# Patient Record
Sex: Female | Born: 1990 | Race: White | Hispanic: No | Marital: Single | Smoking: Former smoker
Health system: Southern US, Community
[De-identification: ages and names within clinical notes are randomized; demographics above are authoritative.]

## PROBLEM LIST (undated history)

## (undated) DIAGNOSIS — F32A Depression, unspecified: Secondary | ICD-10-CM

## (undated) DIAGNOSIS — G932 Benign intracranial hypertension: Secondary | ICD-10-CM

## (undated) HISTORY — DX: Depression, unspecified: F32.A

## (undated) HISTORY — DX: Benign intracranial hypertension: G93.2

---
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
## 2015-10-26 ENCOUNTER — Encounter: Payer: Self-pay | Admitting: Family Medicine

## 2015-10-26 ENCOUNTER — Ambulatory Visit (INDEPENDENT_AMBULATORY_CARE_PROVIDER_SITE_OTHER): Payer: BLUE CROSS/BLUE SHIELD | Admitting: Family Medicine

## 2015-10-26 VITALS — BP 110/70 | HR 80 | Temp 98.1°F | Ht 65.0 in | Wt 214.0 lb

## 2015-10-26 DIAGNOSIS — R059 Cough, unspecified: Secondary | ICD-10-CM

## 2015-10-26 DIAGNOSIS — J0100 Acute maxillary sinusitis, unspecified: Secondary | ICD-10-CM

## 2015-10-26 DIAGNOSIS — M545 Low back pain, unspecified: Secondary | ICD-10-CM

## 2015-10-26 DIAGNOSIS — J101 Influenza due to other identified influenza virus with other respiratory manifestations: Secondary | ICD-10-CM

## 2015-10-26 DIAGNOSIS — R05 Cough: Secondary | ICD-10-CM

## 2015-10-26 LAB — POCT URINALYSIS DIPSTICK
Bilirubin, UA: NEGATIVE
Glucose, UA: NEGATIVE
Ketones, UA: NEGATIVE
Leukocytes, UA: NEGATIVE
Nitrite, UA: NEGATIVE
PH UA: 6
PROTEIN UA: NEGATIVE
RBC UA: NEGATIVE
SPEC GRAV UA: 1.01
UROBILINOGEN UA: 0.2

## 2015-10-26 LAB — POCT INFLUENZA A/B
INFLUENZA B, POC: POSITIVE — AB
Influenza A, POC: NEGATIVE

## 2015-10-26 MED ORDER — OSELTAMIVIR PHOSPHATE 75 MG PO CAPS: 75.0000 mg | ORAL_CAPSULE | Freq: Two times a day (BID) | ORAL | Status: DC

## 2015-10-26 MED ORDER — DOXYCYCLINE HYCLATE 100 MG PO TABS: 100.0000 mg | ORAL_TABLET | Freq: Two times a day (BID) | ORAL | Status: DC

## 2015-10-26 MED ORDER — GUAIFENESIN-CODEINE 100-10 MG/5ML PO SYRP: 5.0000 mL | ORAL_SOLUTION | Freq: Three times a day (TID) | ORAL | Status: DC | PRN

## 2015-10-26 NOTE — Patient Instructions (Signed)

## 2015-10-26 NOTE — Progress Notes (Signed)
Name: Diana Randolph   MRN: [id number]    DOB: 05/30/1991   Date:10/26/2015       Progress Note  Subjective  Chief Complaint  Chief Complaint  Patient presents with  . Sinusitis    back and neck hurting x 2 days ago/ vomitting, cong, sneezing with yellow production. Dry cough- tried Aleve otc    Sinusitis This is a new problem. The current episode started in the past 7 days. The problem has been waxing and waning since onset. The maximum temperature recorded prior to her arrival was 100.4 - 100.9 F. The pain is mild. Associated symptoms include coughing, headaches, sinus pressure and sneezing. Pertinent negatives include no chills, congestion, diaphoresis, ear pain, neck pain, shortness of breath or sore throat. (Prod yellow) Past treatments include acetaminophen. The treatment provided mild relief.  Cough This is a new problem. The current episode started yesterday. The problem has been waxing and waning. The cough is non-productive. Associated symptoms include headaches, myalgias, nasal congestion and postnasal drip. Pertinent negatives include no chest pain, chills, ear pain, fever, heartburn, rash, sore throat, shortness of breath, weight loss or wheezing. She has tried OTC cough suppressant for the symptoms. There is no history of environmental allergies.    No problem-specific assessment & plan notes found for this encounter.   No past medical history on file.  No past surgical history on file.  No family history on file.  Social History   Social History  . Marital Status: Single    Spouse Name: N/A  . Number of Children: N/A  . Years of Education: N/A   Occupational History  . Not on file.   Social History Main Topics  . Smoking status: Current Some Day Smoker  . Smokeless tobacco: Not on file  . Alcohol Use: 0.0 oz/week    0 Standard drinks or equivalent per week  . Drug Use: Yes     Comment: marijuana  . Sexual Activity: Not on file   Other Topics Concern  . Not  on file   Social History Narrative  . No narrative on file    Allergies  Allergen Reactions  . Azithromycin Diarrhea     Review of Systems  Constitutional: Negative for fever, chills, weight loss, malaise/fatigue and diaphoresis.  HENT: Positive for postnasal drip, sinus pressure and sneezing. Negative for congestion, ear discharge, ear pain and sore throat.   Eyes: Negative for blurred vision.  Respiratory: Positive for cough. Negative for sputum production, shortness of breath and wheezing.   Cardiovascular: Negative for chest pain, palpitations and leg swelling.  Gastrointestinal: Negative for heartburn, nausea, abdominal pain, diarrhea, constipation, blood in stool and melena.  Genitourinary: Negative for dysuria, urgency, frequency and hematuria.  Musculoskeletal: Positive for myalgias. Negative for back pain, joint pain and neck pain.  Skin: Negative for rash.  Neurological: Positive for headaches. Negative for dizziness, tingling, sensory change and focal weakness.  Endo/Heme/Allergies: Negative for environmental allergies and polydipsia. Does not bruise/bleed easily.  Psychiatric/Behavioral: Negative for depression and suicidal ideas. The patient is not nervous/anxious and does not have insomnia.      Objective  Filed Vitals:   10/26/15 1441  BP: 110/70  Pulse: 80  Temp: 98.1 F (36.7 C)  TempSrc: Oral  Height: 5\' 5"  (1.651 m)  Weight: 214 lb (97.07 kg)    Physical Exam  Constitutional: She is well-developed, well-nourished, and in no distress. No distress.  HENT:  Head: Normocephalic and atraumatic.  Right Ear: External ear normal.  Left Ear: External ear normal.  Nose: Right sinus exhibits maxillary sinus tenderness. Left sinus exhibits maxillary sinus tenderness.  Mouth/Throat: Oropharynx is clear and moist.  Eyes: Conjunctivae and EOM are normal. Pupils are equal, round, and reactive to light. Right eye exhibits no discharge. Left eye exhibits no  discharge.  Neck: Normal range of motion. Neck supple. No JVD present. No thyromegaly present.  Cardiovascular: Normal rate, regular rhythm, normal heart sounds and intact distal pulses.  Exam reveals no gallop and no friction rub.   No murmur heard. Pulmonary/Chest: Effort normal and breath sounds normal.  Abdominal: Soft. Bowel sounds are normal. She exhibits no mass. There is no tenderness. There is no guarding.  Musculoskeletal: Normal range of motion. She exhibits no edema.       Lumbar back: She exhibits spasm. She exhibits no tenderness.  Lymphadenopathy:    She has no cervical adenopathy.  Neurological: She is alert. She has normal reflexes.  Skin: Skin is warm and dry. She is not diaphoretic.  Psychiatric: Mood and affect normal.  Nursing note and vitals reviewed.     Assessment & Plan  Problem List Items Addressed This Visit    None    Visit Diagnoses    Cough    -  Primary    Relevant Medications    guaiFENesin-codeine (ROBITUSSIN AC) 100-10 MG/5ML syrup    Other Relevant Orders    POCT Influenza A/B (Completed)    Bilateral low back pain without sciatica        Relevant Orders    POCT Urinalysis Dipstick (Completed)    Acute maxillary sinusitis, recurrence not specified        Relevant Medications    doxycycline (VIBRA-TABS) 100 MG tablet    oseltamivir (TAMIFLU) 75 MG capsule    guaiFENesin-codeine (ROBITUSSIN AC) 100-10 MG/5ML syrup    Influenza B        Relevant Medications    oseltamivir (TAMIFLU) 75 MG capsule         Dr. Hayden [facility]  10/26/2015

## 2017-06-23 ENCOUNTER — Ambulatory Visit: Payer: 59 | Admitting: Family Medicine

## 2017-06-23 ENCOUNTER — Encounter: Payer: Self-pay | Admitting: Family Medicine

## 2017-06-23 VITALS — BP 120/78 | HR 80 | Ht 65.0 in | Wt 211.0 lb

## 2017-06-23 DIAGNOSIS — R002 Palpitations: Secondary | ICD-10-CM | POA: Diagnosis not present

## 2017-06-23 DIAGNOSIS — Z7689 Persons encountering health services in other specified circumstances: Secondary | ICD-10-CM

## 2017-06-23 DIAGNOSIS — H50812 Duane's syndrome, left eye: Secondary | ICD-10-CM

## 2017-06-23 DIAGNOSIS — G43809 Other migraine, not intractable, without status migrainosus: Secondary | ICD-10-CM | POA: Diagnosis not present

## 2017-06-23 DIAGNOSIS — Z3009 Encounter for other general counseling and advice on contraception: Secondary | ICD-10-CM | POA: Diagnosis not present

## 2017-06-23 DIAGNOSIS — F419 Anxiety disorder, unspecified: Secondary | ICD-10-CM | POA: Diagnosis not present

## 2017-06-23 NOTE — Progress Notes (Signed)
Name: Diana Randolph   MRN: [id number]    DOB: 02/15/1991   Date:06/23/2017       Progress Note  Subjective  Chief Complaint  Chief Complaint  Patient presents with  . Dizziness    been on Duloxetine for 4 weeks for headaches and anxiety- dizziness has gotten bad/ one of the side effects. Neurology in [place] will not return call. Needs to get in with neurology closer to here    Dizziness  This is a new problem. The current episode started more than 1 month ago (6-8 weeks). The problem occurs intermittently. The problem has been waxing and waning. Associated symptoms include headaches and nausea. Pertinent negatives include no abdominal pain, anorexia, arthralgias, change in bowel habit, chest pain, chills, congestion, coughing, diaphoresis, fatigue, fever, joint swelling, myalgias, neck pain, numbness, rash, sore throat, swollen glands, urinary symptoms, vertigo, visual change, vomiting or weakness. Associated symptoms comments: "lightheaded". The symptoms are aggravated by standing. She has tried acetaminophen (meclizine/arm pain) for the symptoms. The treatment provided moderate relief.  Palpitations   This is a new problem. The current episode started more than 1 month ago (2-3 months). Episode frequency: last episode saturday. The problem has been waxing and waning. Exacerbated by: change in position. Associated symptoms include anxiety, chest fullness, dizziness and nausea. Pertinent negatives include no chest pain, coughing, diaphoresis, fever, irregular heartbeat, malaise/fatigue, near-syncope, numbness, shortness of breath, syncope, vomiting or weakness. Associated symptoms comments: pressure. The treatment provided mild relief. There is no history of hyperthyroidism.    No problem-specific Assessment & Plan notes found for this encounter.   No past medical history on file.  No past surgical history on file.  No family history on file.  Social History   Socioeconomic History   . Marital status: Single    Spouse name: Not on file  . Number of children: Not on file  . Years of education: Not on file  . Highest education level: Not on file  Social Needs  . Financial resource strain: Not on file  . Food insecurity - worry: Not on file  . Food insecurity - inability: Not on file  . Transportation needs - medical: Not on file  . Transportation needs - non-medical: Not on file  Occupational History  . Not on file  Tobacco Use  . Smoking status: Former Smoker    Last attempt to quit: 06/23/2017  . Smokeless tobacco: Never Used  Substance and Sexual Activity  . Alcohol use: Yes    Alcohol/week: 0.0 oz  . Drug use: Yes    Comment: marijuana  . Sexual activity: Yes  Other Topics Concern  . Not on file  Social History Narrative  . Not on file    Allergies  Allergen Reactions  . Azithromycin Diarrhea    Outpatient Medications Prior to Visit  Medication Sig Dispense Refill  . DULoxetine (CYMBALTA) 30 MG capsule Take 30 mg daily by mouth.  5  . norethindrone (MICRONOR,CAMILA,ERRIN) 0.35 MG tablet Take 1 tablet daily by mouth.  6  . acetaZOLAMIDE (DIAMOX) 500 MG capsule Take 500 mg 2 (two) times daily by mouth.  0  . doxycycline (VIBRA-TABS) 100 MG tablet Take 1 tablet (100 mg total) by mouth 2 (two) times daily. 20 tablet 0  . guaiFENesin-codeine (ROBITUSSIN AC) 100-10 MG/5ML syrup Take 5 mLs by mouth 3 (three) times daily as needed for cough. 150 mL 0  . meclizine (ANTIVERT) 25 MG tablet TAKE 1 TABLET BY MOUTH THREE TIMES DAILY AS  NEEDED FOR DIZZINESS  1  . oseltamivir (TAMIFLU) 75 MG capsule Take 1 capsule (75 mg total) by mouth 2 (two) times daily. 10 capsule 0   No facility-administered medications prior to visit.     Review of Systems  Constitutional: Negative for chills, diaphoresis, fatigue, fever, malaise/fatigue and weight loss.  HENT: Negative for congestion, ear discharge, ear pain and sore throat.   Eyes: Negative for blurred vision.   Respiratory: Negative for cough, sputum production, shortness of breath and wheezing.   Cardiovascular: Positive for palpitations. Negative for chest pain, leg swelling, syncope and near-syncope.  Gastrointestinal: Positive for nausea. Negative for abdominal pain, anorexia, blood in stool, change in bowel habit, constipation, diarrhea, heartburn, melena and vomiting.  Genitourinary: Negative for dysuria, frequency, hematuria and urgency.  Musculoskeletal: Negative for arthralgias, back pain, joint pain, joint swelling, myalgias and neck pain.  Skin: Negative for rash.  Neurological: Positive for dizziness and headaches. Negative for vertigo, tingling, sensory change, focal weakness, weakness and numbness.  Endo/Heme/Allergies: Negative for environmental allergies and polydipsia. Does not bruise/bleed easily.  Psychiatric/Behavioral: Negative for depression and suicidal ideas. The patient is nervous/anxious. The patient does not have insomnia.      Objective  Vitals:   06/23/17 1525  BP: 120/78  Pulse: 80  Weight: 211 lb (95.7 kg)  Height: 5\' 5"  (1.651 m)    Physical Exam  Constitutional: She is well-developed, well-nourished, and in no distress. No distress.  HENT:  Head: Normocephalic and atraumatic.  Right Ear: External ear normal.  Left Ear: External ear normal.  Nose: Nose normal.  Mouth/Throat: Oropharynx is clear and moist.  Eyes: Conjunctivae and EOM are normal. Pupils are equal, round, and reactive to light. Right eye exhibits no discharge. Left eye exhibits no discharge.  Neck: Normal range of motion. Neck supple. No JVD present. No thyromegaly present.  Cardiovascular: Normal rate, regular rhythm, normal heart sounds and intact distal pulses. Exam reveals no gallop and no friction rub.  No murmur heard. Pulmonary/Chest: Effort normal and breath sounds normal. She has no wheezes. She has no rales.  Abdominal: Soft. Bowel sounds are normal. She exhibits no mass. There is  no tenderness. There is no guarding.  Musculoskeletal: Normal range of motion. She exhibits no edema.  Lymphadenopathy:    She has no cervical adenopathy.  Neurological: She is alert. She has normal sensation, normal strength and normal reflexes. A cranial nerve deficit is present.  Abn left ocular motion  Skin: Skin is warm and dry. No rash noted. She is not diaphoretic.  Psychiatric: Mood and affect normal.  Nursing note and vitals reviewed.     Assessment & Plan  Problem List Items Addressed This Visit    None    Visit Diagnoses    Establishing care with new doctor, encounter for    -  Primary   Palpitations       Relevant Orders   TSH   Headache, variant migraine       [facility]   Relevant Medications   DULoxetine (CYMBALTA) 30 MG capsule   Other Relevant Orders   Ambulatory referral to Neurology   Anxiety       cont cymbalta   Relevant Medications   DULoxetine (CYMBALTA) 30 MG capsule   Encounter for other general counseling and advice on contraception       Relevant Orders   Ambulatory referral to Gynecology   Duane syndrome of left eye  No orders of the defined types were placed in this encounter.     Dr. Hayden Rasmusseneanna Margaretha [facility]  06/23/17

## 2017-06-24 LAB — TSH: TSH: 3.5 u[IU]/mL (ref 0.450–4.500)

## 2018-02-17 ENCOUNTER — Other Ambulatory Visit
Admission: RE | Admit: 2018-02-17 | Discharge: 2018-02-17 | Disposition: A | Discharge status: Home / Self Care | Payer: Self-pay | Source: Ambulatory Visit | Attending: Family Medicine | Admitting: Family Medicine

## 2018-02-17 ENCOUNTER — Ambulatory Visit (INDEPENDENT_AMBULATORY_CARE_PROVIDER_SITE_OTHER): Payer: Self-pay | Admitting: Family Medicine

## 2018-02-17 ENCOUNTER — Encounter: Payer: Self-pay | Admitting: Family Medicine

## 2018-02-17 VITALS — BP 110/62 | HR 106 | Temp 98.3°F | Ht 65.0 in | Wt 222.0 lb

## 2018-02-17 DIAGNOSIS — J029 Acute pharyngitis, unspecified: Secondary | ICD-10-CM

## 2018-02-17 DIAGNOSIS — R509 Fever, unspecified: Secondary | ICD-10-CM | POA: Insufficient documentation

## 2018-02-17 LAB — CBC WITH DIFFERENTIAL/PLATELET
BASOS ABS: 0 10*3/uL (ref 0–0.1)
Basophils Relative: 0 %
EOS PCT: 0 %
Eosinophils Absolute: 0 10*3/uL (ref 0–0.7)
HCT: 43 % (ref 35.0–47.0)
HEMOGLOBIN: 14.3 g/dL (ref 12.0–16.0)
LYMPHS ABS: 0.9 10*3/uL — AB (ref 1.0–3.6)
Lymphocytes Relative: 10 %
MCH: 27.5 pg (ref 26.0–34.0)
MCHC: 33.2 g/dL (ref 32.0–36.0)
MCV: 82.8 fL (ref 80.0–100.0)
Monocytes Absolute: 0.5 10*3/uL (ref 0.2–0.9)
Monocytes Relative: 6 %
NEUTROS ABS: 7.7 10*3/uL — AB (ref 1.4–6.5)
NEUTROS PCT: 84 %
PLATELETS: 419 10*3/uL (ref 150–440)
RBC: 5.19 MIL/uL (ref 3.80–5.20)
RDW: 13.9 % (ref 11.5–14.5)
WBC: 9.1 10*3/uL (ref 3.6–11.0)

## 2018-02-17 LAB — MONONUCLEOSIS SCREEN: Mono Screen: NEGATIVE

## 2018-02-17 MED ORDER — GENERIC EXTERNAL MEDICATION
1.00 | Status: DC
Start: ? — End: 2018-02-17

## 2018-02-17 MED ORDER — DEXTROSE 50 % IV SOLN
12.50 | INTRAVENOUS | Status: DC
Start: ? — End: 2018-02-17

## 2018-02-17 MED ORDER — LIDOCAINE HCL (PF) 1 % IJ SOLN
0.50 | INTRAMUSCULAR | Status: DC
Start: ? — End: 2018-02-17

## 2018-02-17 NOTE — Patient Instructions (Signed)
Infectious Mononucleosis  Infectious mononucleosis is a viral infection. It is often referred to as "mono." It causes symptoms that affect various areas of the body, including the throat, upper air passages, and lymph glands. The liver or spleen may also be affected.  The virus spreads from person to person (is contagious) through close contact. The illness is usually not serious, and it typically goes away in 2-4 weeks without treatment. In rare cases, symptoms can be more severe and last longer, sometimes up to several months.  What are the causes?  This condition is commonly caused by the Epstein-Barr virus. This virus spreads through:   Contact with an infected person's saliva or other bodily fluids, often through:  ? Kissing.  ? Sexual contact.  ? Coughing.  ? Sneezing.   Sharing utensils or drinking glasses that were recently used by an infected person.   Blood transfusions.   Organ transplantation.    What increases the risk?  You are more likely to develop this condition if:   You are 15-24 years old.    What are the signs or symptoms?  Symptoms of this condition usually appear 4-6 weeks after infection. Symptoms may develop slowly and occur at different times. Common symptoms include:   Sore throat.   Headache.   Extreme fatigue.   Muscle aches.   Swollen glands.   Fever.   Poor appetite.   Rash.    Other symptoms include:   Enlarged liver or spleen.   Nausea.   Abdominal pain.    How is this diagnosed?  This condition may be diagnosed based on:   Your medical history.   Your symptoms.   A physical exam.   Blood tests to confirm the diagnosis.    How is this treated?  There is no cure for this condition. Infectious mononucleosis usually goes away on its own with time. Treatment can help relieve symptoms and may include:   Taking medicines to relieve pain and fever.   Drinking plenty of fluids.   Getting a lot of rest.   Medicine (corticosteroids)to reduce swelling. This may be used  if swelling in the throat causes breathing or swallowing problems.    In some severe cases, treatment has to be given in a hospital.  Follow these instructions at home:  Medicines   Take over-the-counter and prescription medicines only as told by your health care provider.   Do not take ampicillin or amoxicillin. This may cause a rash.   If you are under 18, do not take aspirin because of the association with Reye syndrome.  Activity   Rest as needed.   Do not participate in any of the following activities until your health care provider approves:  ? Contact sports. You may need to wait at least a month before participating in sports.  ? Exercise that requires a lot of energy.  ? Heavy lifting.   Gradually resume your normal activities after your fever is gone, or when your health care provider tells you that you can. Be sure to rest when you get tired.  General instructions   Avoid kissing or sharing utensils or drinking glasses until your health care provider tells you that you are no longer contagious.   Drink enough fluid to keep your urine clear or pale yellow.   Do not drink alcohol.   If you have a sore throat:  ? Gargle with a salt-water mixture 3-4 times a day or as needed. To make a salt-water mixture,   completely dissolve -1 tsp of salt in 1 cup of warm water.  ? Eat soft foods. Cold foods such as ice cream or frozen ice pops can soothe a sore throat.  ? Try sucking on hard candy.   Wash your hands often with soap and water to avoid spreading the infection. If soap and water are not available, use hand sanitizer.  How is this prevented?   Avoid contact with people who are infected with mononucleosis. An infected person may not always appear ill, but he or she can still spread the virus.   Avoid sharing utensils, drinking glasses, or toothbrushes.   Wash your hands frequently with soap and water. If soap and water are not available, use hand sanitizer.   Use the inside of your elbow to cover  your mouth when coughing or sneezing.  Contact a health care provider if:   Your fever is not gone after 10 days.   You have swollen lymph nodes that are not back to normal after 4 weeks.   Your activity level is not back to normal after 2 months.   Your skin or the white parts of your eyes turn yellow (jaundice).   You have constipation. This may mean that you have:  ? Fewer bowel movements in a week than normal.  ? Difficulty having a bowel movement.  ? Stools that are dry, hard, or larger than normal.  Get help right away if:   You have severe pain in your abdomen or shoulder.   You are drooling.   You have trouble swallowing.   You have trouble breathing.   You develop a stiff neck.   You develop a severe headache.   You cannot stop vomiting.   You have jerky movements that you cannot control (seizures).   You are confused.   You have trouble with balance.   Your nose or gums begin to bleed.   You have signs of dehydration. These may include:  ? Weakness.  ? Sunken eyes.  ? Pale skin.  ? Dry mouth.  ? Rapid breathing or pulse.  Summary   Infectious mononucleosis, or "mono," is an infection caused by the Epstein-Barr virus.   The virus that causes this condition is spread through bodily fluids. The virus is most commonly spread by kissing or sharing drinks or utensils with an infected person.   You are more likely to develop this infection if you are 15-24 years old.   Symptoms of this condition can include sore throat, headache, fever, swollen glands, muscle aches, extreme fatigue, and swollen liver or spleen.   There is no cure for this condition. The goal of treatment is to help relieve symptoms. Treatment may include drinking plenty of water, getting a lot of rest, and taking pain relievers.  This information is not intended to replace advice given to you by your health care provider. Make sure you discuss any questions you have with your health care provider.  Document Released:  07/26/2000 Document Revised: 04/16/2016 Document Reviewed: 04/16/2016  [employer] Interactive Patient Education  2017 [employer]

## 2018-02-17 NOTE — Progress Notes (Addendum)
Name: Diana Randolph   MRN: [id number]    DOB: 18-Oct-1990   Date:02/17/2018       Progress Note  Subjective  Chief Complaint  Chief Complaint  Patient presents with  . Follow-up    seen in ER for sore throat on the 6th of July- was given Aug and Medrol dose pack- throat isn't better, but only on day 3 of antibiotic    Sore Throat   This is a new problem. The current episode started in the past 7 days. The pain is worse on the left side. Maximum temperature: low grade fever. The fever has been present for 3 to 4 days. The pain is moderate. Associated symptoms include headaches, neck pain, swollen glands and trouble swallowing. Pertinent negatives include no abdominal pain, congestion, coughing, diarrhea, drooling, ear discharge, ear pain, hoarse voice, shortness of breath, stridor or vomiting. She has had no exposure to strep or mono. She has tried acetaminophen (presently on augmentin) for the symptoms. The treatment provided mild relief.    No problem-specific Assessment & Plan notes found for this encounter.   History reviewed. No pertinent past medical history.  History reviewed. No pertinent surgical history.  History reviewed. No pertinent family history.  Social History   Socioeconomic History  . Marital status: Single    Spouse name: Not on file  . Number of children: Not on file  . Years of education: Not on file  . Highest education level: Not on file  Occupational History  . Not on file  Social Needs  . Financial resource strain: Not on file  . Food insecurity:    Worry: Not on file    Inability: Not on file  . Transportation needs:    Medical: Not on file    Non-medical: Not on file  Tobacco Use  . Smoking status: Former Smoker    Last attempt to quit: 06/23/2017    Years since quitting: 0.6  . Smokeless tobacco: Never Used  Substance and Sexual Activity  . Alcohol use: Yes    Alcohol/week: 0.0 oz  . Drug use: Yes    Comment: marijuana  . Sexual activity:  Yes  Lifestyle  . Physical activity:    Days per week: Not on file    Minutes per session: Not on file  . Stress: Not on file  Relationships  . Social connections:    Talks on phone: Not on file    Gets together: Not on file    Attends religious service: Not on file    Active member of club or organization: Not on file    Attends meetings of clubs or organizations: Not on file    Relationship status: Not on file  . Intimate partner violence:    Fear of current or ex partner: Not on file    Emotionally abused: Not on file    Physically abused: Not on file    Forced sexual activity: Not on file  Other Topics Concern  . Not on file  Social History Narrative  . Not on file    Allergies  Allergen Reactions  . Azithromycin Diarrhea    Outpatient Medications Prior to Visit  Medication Sig Dispense Refill  . amoxicillin-clavulanate (AUGMENTIN) 875-125 MG tablet Take 1 tablet by mouth 2 (two) times daily.    . DULoxetine (CYMBALTA) 30 MG capsule Take 30 mg daily by mouth.  5  . methylPREDNISolone (MEDROL DOSEPAK) 4 MG TBPK tablet Follow package directions.    . norethindrone (  MICRONOR,CAMILA,ERRIN) 0.35 MG tablet Take 1 tablet daily by mouth.  6  . nortriptyline (PAMELOR) 10 MG capsule Take 1 capsule by mouth at bedtime. Ashley Stepp     No facilitDot Lanesy-administered medications prior to visit.     Review of Systems  HENT: Positive for trouble swallowing. Negative for congestion, drooling, ear discharge, ear pain and hoarse voice.   Respiratory: Negative for cough, shortness of breath and stridor.   Gastrointestinal: Negative for abdominal pain, diarrhea and vomiting.  Musculoskeletal: Positive for neck pain.  Neurological: Positive for headaches.     Objective  Vitals:   02/17/18 1411  BP: 110/62  Pulse: (!) 106  Temp: 98.3 F (36.8 C)  TempSrc: Oral  Weight: 222 lb (100.7 kg)  Height: 5\' 5"  (1.651 m)    Physical Exam  Constitutional: No distress.  HENT:  Head:  Normocephalic and atraumatic.  Right Ear: Tympanic membrane and external ear normal.  Left Ear: Tympanic membrane and external ear normal.  Nose: Nose normal. No mucosal edema.  Mouth/Throat: Mucous membranes are normal. Oropharyngeal exudate and posterior oropharyngeal erythema present. No posterior oropharyngeal edema or tonsillar abscesses.  Eyes: Pupils are equal, round, and reactive to light. Conjunctivae and EOM are normal. Right eye exhibits no discharge. Left eye exhibits no discharge.  Neck: Normal range of motion. Neck supple. No JVD present. No thyromegaly present.  Cardiovascular: Normal rate, regular rhythm, normal heart sounds and intact distal pulses. Exam reveals no gallop and no friction rub.  No murmur heard. Pulmonary/Chest: Effort normal and breath sounds normal.  Abdominal: Soft. Bowel sounds are normal. She exhibits no mass. There is no splenomegaly. There is no tenderness. There is no guarding.  Musculoskeletal: Normal range of motion. She exhibits no edema.  Lymphadenopathy:       Head (right side): Submandibular adenopathy present. No submental adenopathy present.       Head (left side): Submandibular adenopathy present. No submental adenopathy present.    She has no cervical adenopathy.  Neurological: She is alert. She has normal reflexes.  Skin: Skin is warm and dry. She is not diaphoretic.      Assessment & Plan  Problem List Items Addressed This Visit    None    Visit Diagnoses    Pharyngitis, unspecified etiology    -  Primary   Continues sore throat . Continue augmentin/medrol dosepack/ recheck cbc diff/ and mono/ diferential :partially treated strep/vs mono/ mono-negative- continue tx      No orders of the defined types were placed in this encounter.     Dr. Hayden Rasmusseneanna Taylr Meuth [facility]  02/17/18

## 2019-04-15 ENCOUNTER — Other Ambulatory Visit: Payer: Self-pay

## 2019-04-15 DIAGNOSIS — Z20822 Contact with and (suspected) exposure to covid-19: Secondary | ICD-10-CM

## 2019-04-16 LAB — NOVEL CORONAVIRUS, NAA: SARS-CoV-2, NAA: NOT DETECTED

## 2020-06-16 ENCOUNTER — Other Ambulatory Visit: Payer: Self-pay

## 2020-06-16 ENCOUNTER — Ambulatory Visit
Admission: EM | Admit: 2020-06-16 | Discharge: 2020-06-16 | Disposition: A | Discharge status: Home / Self Care | Payer: Self-pay | Attending: Emergency Medicine | Admitting: Emergency Medicine

## 2020-06-16 DIAGNOSIS — Z20822 Contact with and (suspected) exposure to covid-19: Secondary | ICD-10-CM | POA: Insufficient documentation

## 2020-06-16 DIAGNOSIS — R051 Acute cough: Secondary | ICD-10-CM | POA: Insufficient documentation

## 2020-06-16 DIAGNOSIS — Z87891 Personal history of nicotine dependence: Secondary | ICD-10-CM | POA: Insufficient documentation

## 2020-06-16 DIAGNOSIS — Z881 Allergy status to other antibiotic agents status: Secondary | ICD-10-CM | POA: Insufficient documentation

## 2020-06-16 DIAGNOSIS — J069 Acute upper respiratory infection, unspecified: Secondary | ICD-10-CM | POA: Insufficient documentation

## 2020-06-16 DIAGNOSIS — Z79899 Other long term (current) drug therapy: Secondary | ICD-10-CM | POA: Insufficient documentation

## 2020-06-16 LAB — GROUP A STREP BY PCR: Group A Strep by PCR: NOT DETECTED

## 2020-06-16 MED ORDER — PROMETHAZINE-DM 6.25-15 MG/5ML PO SYRP: 5.0000 mL | ORAL_SOLUTION | Freq: Four times a day (QID) | ORAL | 0 refills | Status: DC | PRN

## 2020-06-16 NOTE — ED Triage Notes (Signed)
Patient complains of sore throat, body aches, chills, nasal congestion x 3 days.

## 2020-06-16 NOTE — ED Provider Notes (Signed)
[facility]    CSN: [id number] Arrival date & time: 06/16/20  1917      History   Chief Complaint Chief Complaint  Patient presents with  . Sore Throat    HPI Diana Randolph is a 29 y.o. female.   81-year-old female here for evaluation of sore throat, body aches, chills, cough, and nasal congestion.  Patient reports that her symptoms started 3 days ago.  She works at [employer] and all of her coworkers have similar symptoms.  She reports that she has a dry cough, sinus pain, clear nasal discharge, shortness of breath and wheezing.  Patient denies fever, ear pressure, body aches, or changes to his taste or smell sense.  Patient has been fully vaccinated with visor against Covid.     History reviewed. No pertinent past medical history.  There are no problems to display for this patient.   History reviewed. No pertinent surgical history.  OB History   No obstetric history on file.      Home Medications    Prior to Admission medications   Medication Sig Start Date End Date Taking? Authorizing Provider  DULoxetine (CYMBALTA) 30 MG capsule Take 30 mg daily by mouth. 06/20/17  Yes [provider]  norethindrone (MICRONOR,CAMILA,ERRIN) 0.35 MG tablet Take 1 tablet daily by mouth. 06/20/17  Yes [provider]  nortriptyline (PAMELOR) 10 MG capsule Take 1 capsule by mouth at bedtime. Dot Lanes 01/01/18  Yes [provider]  promethazine-dextromethorphan (PROMETHAZINE-DM) 6.25-15 MG/5ML syrup Take 5 mLs by mouth 4 (four) times daily as needed. 06/16/20   Becky Augusta, NP    Family History Family History  Problem Relation Age of Onset  . Healthy Mother   . Heart attack Father     Social History Social History   Tobacco Use  . Smoking status: Former Smoker    Quit date: 06/23/2017    Years since quitting: 2.9  . Smokeless tobacco: Never Used  Vaping Use  . Vaping Use: Never used  Substance Use Topics  . Alcohol use: Yes     Alcohol/week: 0.0 standard drinks  . Drug use: Yes    Comment: marijuana     Allergies   Azithromycin   Review of Systems Review of Systems  Constitutional: Negative for activity change, appetite change and fever.  HENT: Positive for congestion, postnasal drip, rhinorrhea, sinus pressure and sore throat. Negative for ear pain.   Respiratory: Positive for cough, shortness of breath and wheezing.   Cardiovascular: Negative for chest pain.  Gastrointestinal: Negative for diarrhea, nausea and vomiting.  Genitourinary: Negative for hematuria.  Musculoskeletal: Positive for arthralgias and myalgias.  Skin: Negative for rash.  Neurological: Negative for headaches.  Hematological: Negative.   Psychiatric/Behavioral: Negative.      Physical Exam Triage Vital Signs ED Triage Vitals  Enc Vitals Group     BP 06/16/20 1930 (!) 125/100     Pulse Rate 06/16/20 1930 (!) 115     Resp 06/16/20 1930 18     Temp 06/16/20 1930 98.6 F (37 C)     Temp Source 06/16/20 1930 Oral     SpO2 06/16/20 1930 100 %     Weight 06/16/20 1928 220 lb (99.8 kg)     Height 06/16/20 1928 5\' 6"  (1.676 m)     Head Circumference --      Peak Flow --      Pain Score 06/16/20 1927 7     Pain Loc --  Pain Edu? --      Excl. in GC? --    No data found.  Updated Vital Signs BP (!) 125/100 (BP Location: Left Arm)   Pulse (!) 115   Temp 98.6 F (37 C) (Oral)   Resp 18   Ht 5\' 6"  (1.676 m)   Wt 220 lb (99.8 kg)   LMP 05/30/2020   SpO2 100%   BMI 35.51 kg/m   Visual Acuity Right Eye Distance:   Left Eye Distance:   Bilateral Distance:    Right Eye Near:   Left Eye Near:    Bilateral Near:     Physical Exam Vitals and nursing note reviewed.  Constitutional:      General: She is not in acute distress.    Appearance: She is well-developed. She is not toxic-appearing.  HENT:     Head: Normocephalic and atraumatic.     Right Ear: Tympanic membrane and ear canal normal. No middle ear  effusion. Tympanic membrane is not erythematous.     Left Ear: Tympanic membrane and ear canal normal.  No middle ear effusion. Tympanic membrane is not erythematous.     Nose: Congestion and rhinorrhea present.     Comments: Nasal mucosa is erythematous and edematous with clear nasal discharge.    Mouth/Throat:     Mouth: Mucous membranes are moist.     Pharynx: Oropharynx is clear. Posterior oropharyngeal erythema present. No oropharyngeal exudate.     Tonsils: No tonsillar exudate. 0 on the right. 0 on the left.     Comments: Posterior oropharynx has erythema and clear postnasal drip.  Tonsillar pillars are pink, edematous, and free of exudate. Eyes:     Conjunctiva/sclera: Conjunctivae normal.     Pupils: Pupils are equal, round, and reactive to light.  Cardiovascular:     Rate and Rhythm: Normal rate and regular rhythm.     Heart sounds: Normal heart sounds. No murmur heard.  No gallop.   Pulmonary:     Effort: Pulmonary effort is normal.     Breath sounds: Normal breath sounds. No wheezing, rhonchi or rales.  Musculoskeletal:     Cervical back: Normal range of motion and neck supple.  Lymphadenopathy:     Cervical: No cervical adenopathy.  Skin:    General: Skin is warm and dry.     Capillary Refill: Capillary refill takes less than 2 seconds.     Findings: No rash.  Neurological:     General: No focal deficit present.     Mental Status: She is alert and oriented to person, place, and time.  Psychiatric:        Mood and Affect: Mood normal.        Behavior: Behavior normal.      UC Treatments / Results  Labs (all labs ordered are listed, but only abnormal results are displayed) Labs Reviewed  GROUP A STREP BY PCR  SARS CORONAVIRUS 2 (TAT 6-24 HRS)    EKG   Radiology No results found.  Procedures Procedures (including critical care time)  Medications Ordered in UC Medications - No data to display  Initial Impression / Assessment and Plan / UC Course  I  have reviewed the triage vital signs and the nursing notes.  Pertinent labs & imaging results that were available during my care of the patient were reviewed by me and considered in my medical decision making (see chart for details).   Evaluation of URI symptoms x3 days.  She works at [employer] and all of her coworkers have similar symptoms.  Physical exam reveals edematous and erythematous nasal mucosa with clear nasal discharge and clear postnasal drip.  Tonsillar pillars are unremarkable.  No lymphadenopathy appreciable on exam.  Lung sounds have wheezing in upper lobes but clear throughout.  No rales or rhonchi.  Will check strep and Covid.  Strep PCR is negative.  Covid is pending.  Will DC patient home with diagnosis of URI.  Will treat with Promethazine DM for cough.  Patient instructed to use her albuterol inhaler that she was prescribed earlier.  Final Clinical Impressions(s) / UC Diagnoses   Final diagnoses:  Viral URI with cough     Discharge Instructions     Isolate at home until your Covid test comes back.  If your test is positive you will need to quarantine for 10 days from the start of your symptoms.  After 10 days you can break quarantine if your symptoms have improved and you have not had a fever for 24 hours.  Use Tylenol and ibuprofen as needed for fever and pain.  Take the Promethazine DM every 6 hours as needed for cough and congestion.  This will make you drowsy so it may be better to save it for bedtime and use something like Delsym during the day.  If your Covid test is positive we will refer you to the infusion clinic for monoclonal antibody infusion.  If you develop worsening shortness of breath, cannot catch her breath at rest, or cannot speak a full sentence the need to go to the ER for evaluation.    ED Prescriptions    Medication Sig Dispense Auth. Provider   promethazine-dextromethorphan (PROMETHAZINE-DM) 6.25-15 MG/5ML syrup Take 5 mLs by mouth 4  (four) times daily as needed. 118 mL Becky Augusta, NP     PDMP not reviewed this encounter.   Becky Augusta, NP 06/16/20 2019

## 2020-06-16 NOTE — Discharge Instructions (Addendum)
Isolate at home until your Covid test comes back.  If your test is positive you will need to quarantine for 10 days from the start of your symptoms.  After 10 days you can break quarantine if your symptoms have improved and you have not had a fever for 24 hours.  Use Tylenol and ibuprofen as needed for fever and pain.  Take the Promethazine DM every 6 hours as needed for cough and congestion.  This will make you drowsy so it may be better to save it for bedtime and use something like Delsym during the day.  If your Covid test is positive we will refer you to the infusion clinic for monoclonal antibody infusion.  If you develop worsening shortness of breath, cannot catch her breath at rest, or cannot speak a full sentence the need to go to the ER for evaluation.

## 2020-06-17 LAB — SARS CORONAVIRUS 2 (TAT 6-24 HRS): SARS Coronavirus 2: NEGATIVE

## 2021-01-25 ENCOUNTER — Other Ambulatory Visit: Payer: Self-pay | Admitting: Family Medicine

## 2021-01-25 DIAGNOSIS — R102 Pelvic and perineal pain: Secondary | ICD-10-CM

## 2021-01-26 ENCOUNTER — Ambulatory Visit (HOSPITAL_BASED_OUTPATIENT_CLINIC_OR_DEPARTMENT_OTHER)
Admission: RE | Admit: 2021-01-26 | Discharge: 2021-01-26 | Disposition: A | Discharge status: Home / Self Care | Payer: BC Managed Care – PPO | Source: Ambulatory Visit | Attending: Family Medicine | Admitting: Family Medicine

## 2021-01-26 ENCOUNTER — Other Ambulatory Visit: Payer: Self-pay

## 2021-01-26 DIAGNOSIS — R102 Pelvic and perineal pain: Secondary | ICD-10-CM | POA: Diagnosis present

## 2021-04-06 ENCOUNTER — Emergency Department (HOSPITAL_COMMUNITY): Payer: Self-pay

## 2021-04-06 ENCOUNTER — Other Ambulatory Visit: Payer: Self-pay

## 2021-04-06 ENCOUNTER — Emergency Department (HOSPITAL_COMMUNITY)
Admission: EM | Admit: 2021-04-06 | Discharge: 2021-04-06 | Disposition: A | Discharge status: Home / Self Care | Payer: Self-pay | Attending: Emergency Medicine | Admitting: Emergency Medicine

## 2021-04-06 DIAGNOSIS — R1110 Vomiting, unspecified: Secondary | ICD-10-CM

## 2021-04-06 DIAGNOSIS — E860 Dehydration: Secondary | ICD-10-CM

## 2021-04-06 DIAGNOSIS — R197 Diarrhea, unspecified: Secondary | ICD-10-CM | POA: Insufficient documentation

## 2021-04-06 DIAGNOSIS — Z87891 Personal history of nicotine dependence: Secondary | ICD-10-CM | POA: Insufficient documentation

## 2021-04-06 DIAGNOSIS — R1084 Generalized abdominal pain: Secondary | ICD-10-CM

## 2021-04-06 DIAGNOSIS — R1013 Epigastric pain: Secondary | ICD-10-CM | POA: Insufficient documentation

## 2021-04-06 DIAGNOSIS — Y900 Blood alcohol level of less than 20 mg/100 ml: Secondary | ICD-10-CM | POA: Insufficient documentation

## 2021-04-06 DIAGNOSIS — K529 Noninfective gastroenteritis and colitis, unspecified: Secondary | ICD-10-CM

## 2021-04-06 DIAGNOSIS — Z20822 Contact with and (suspected) exposure to covid-19: Secondary | ICD-10-CM | POA: Insufficient documentation

## 2021-04-06 DIAGNOSIS — R112 Nausea with vomiting, unspecified: Secondary | ICD-10-CM | POA: Insufficient documentation

## 2021-04-06 LAB — CBC WITH DIFFERENTIAL/PLATELET
Abs Immature Granulocytes: 0.16 10*3/uL — ABNORMAL HIGH (ref 0.00–0.07)
Basophils Absolute: 0 10*3/uL (ref 0.0–0.1)
Basophils Relative: 0 %
Eosinophils Absolute: 0 10*3/uL (ref 0.0–0.5)
Eosinophils Relative: 0 %
HCT: 47 % — ABNORMAL HIGH (ref 36.0–46.0)
Hemoglobin: 15.8 g/dL — ABNORMAL HIGH (ref 12.0–15.0)
Immature Granulocytes: 1 %
Lymphocytes Relative: 7 %
Lymphs Abs: 1.2 10*3/uL (ref 0.7–4.0)
MCH: 28.8 pg (ref 26.0–34.0)
MCHC: 33.6 g/dL (ref 30.0–36.0)
MCV: 85.8 fL (ref 80.0–100.0)
Monocytes Absolute: 0.7 10*3/uL (ref 0.1–1.0)
Monocytes Relative: 4 %
Neutro Abs: 14.4 10*3/uL — ABNORMAL HIGH (ref 1.7–7.7)
Neutrophils Relative %: 88 %
Platelets: 513 10*3/uL — ABNORMAL HIGH (ref 150–400)
RBC: 5.48 MIL/uL — ABNORMAL HIGH (ref 3.87–5.11)
RDW: 13.1 % (ref 11.5–15.5)
WBC: 16.5 10*3/uL — ABNORMAL HIGH (ref 4.0–10.5)
nRBC: 0 % (ref 0.0–0.2)

## 2021-04-06 LAB — PREGNANCY, URINE: Preg Test, Ur: NEGATIVE

## 2021-04-06 LAB — COMPREHENSIVE METABOLIC PANEL
ALT: 11 U/L (ref 0–44)
AST: 24 U/L (ref 15–41)
Albumin: 4.1 g/dL (ref 3.5–5.0)
Alkaline Phosphatase: 51 U/L (ref 38–126)
Anion gap: 12 (ref 5–15)
BUN: 10 mg/dL (ref 6–20)
CO2: 19 mmol/L — ABNORMAL LOW (ref 22–32)
Calcium: 9.4 mg/dL (ref 8.9–10.3)
Chloride: 105 mmol/L (ref 98–111)
Creatinine, Ser: 0.81 mg/dL (ref 0.44–1.00)
GFR, Estimated: >60 mL/min (ref >60)
Glucose, Bld: 173 mg/dL — ABNORMAL HIGH (ref 70–99)
Potassium: 3.8 mmol/L (ref 3.5–5.1)
Sodium: 136 mmol/L (ref 135–145)
Total Bilirubin: 1.4 mg/dL — ABNORMAL HIGH (ref 0.3–1.2)
Total Protein: 7.1 g/dL (ref 6.5–8.1)

## 2021-04-06 LAB — URINALYSIS, MICROSCOPIC (REFLEX)

## 2021-04-06 LAB — URINALYSIS, ROUTINE W REFLEX MICROSCOPIC
Bilirubin Urine: NEGATIVE
Glucose, UA: NEGATIVE mg/dL
Hgb urine dipstick: NEGATIVE
Ketones, ur: >80 mg/dL — AB
Nitrite: NEGATIVE
Protein, ur: NEGATIVE mg/dL
Specific Gravity, Urine: 1.025 (ref 1.005–1.030)
pH: 8 (ref 5.0–8.0)

## 2021-04-06 LAB — LACTIC ACID, PLASMA: Lactic Acid, Venous: 4.5 mmol/L (ref 0.5–1.9)

## 2021-04-06 LAB — LIPASE, BLOOD: Lipase: 26 U/L (ref 11–51)

## 2021-04-06 LAB — ETHANOL: Alcohol, Ethyl (B): <10 mg/dL (ref <10)

## 2021-04-06 LAB — RESP PANEL BY RT-PCR (FLU A&B, COVID) ARPGX2
Influenza A by PCR: NEGATIVE
Influenza B by PCR: NEGATIVE
SARS Coronavirus 2 by RT PCR: NEGATIVE

## 2021-04-06 MED ORDER — METHYLPREDNISOLONE SODIUM SUCC 125 MG IJ SOLR
125.0000 mg | Freq: Once | INTRAMUSCULAR | Status: AC
  Administered 2021-04-06: 125 mg via INTRAVENOUS
  Filled 2021-04-06: qty 2

## 2021-04-06 MED ORDER — ONDANSETRON 4 MG PO TBDP: 4.0000 mg | ORAL_TABLET | Freq: Three times a day (TID) | ORAL | 0 refills | Status: DC | PRN

## 2021-04-06 MED ORDER — IOHEXOL 350 MG/ML SOLN
100.0000 mL | Freq: Once | INTRAVENOUS | Status: AC | PRN
  Administered 2021-04-06: 100 mL via INTRAVENOUS

## 2021-04-06 MED ORDER — SODIUM CHLORIDE 0.9 % IV BOLUS
1000.0000 mL | Freq: Once | INTRAVENOUS | Status: AC
  Administered 2021-04-06: 1000 mL via INTRAVENOUS

## 2021-04-06 MED ORDER — PREDNISONE 10 MG (21) PO TBPK: ORAL_TABLET | Freq: Every day | ORAL | 0 refills | Status: DC

## 2021-04-06 MED ORDER — LACTATED RINGERS IV BOLUS
1000.0000 mL | Freq: Once | INTRAVENOUS | Status: AC
  Administered 2021-04-06: 1000 mL via INTRAVENOUS

## 2021-04-06 MED ORDER — ONDANSETRON HCL 4 MG/2ML IJ SOLN
4.0000 mg | Freq: Once | INTRAMUSCULAR | Status: AC
  Administered 2021-04-06: 4 mg via INTRAVENOUS
  Filled 2021-04-06: qty 2

## 2021-04-06 MED ORDER — SODIUM CHLORIDE 0.9 % IV SOLN
12.5000 mg | Freq: Once | INTRAVENOUS | Status: AC
  Administered 2021-04-06: 12.5 mg via INTRAVENOUS
  Filled 2021-04-06: qty 0.5

## 2021-04-06 MED ORDER — OMEPRAZOLE 20 MG PO CPDR: 20.0000 mg | DELAYED_RELEASE_CAPSULE | Freq: Every day | ORAL | 0 refills | Status: DC

## 2021-04-06 MED ORDER — METOCLOPRAMIDE HCL 5 MG/ML IJ SOLN
10.0000 mg | Freq: Once | INTRAMUSCULAR | Status: AC
  Administered 2021-04-06: 10 mg via INTRAVENOUS

## 2021-04-06 NOTE — ED Provider Notes (Signed)
[facility] EMERGENCY DEPARTMENT Provider Note   CSN: [id number] Arrival date & time: 04/06/21  6568     History Chief Complaint  Patient presents with   Abdominal Pain   Vomiting    Diana Randolph is a 30 y.o. female.  Patient presents with recurrent epigastric and central abdominal pain vomiting and some diarrhea since this morning.  No new foods or medications.  Patient feels sweaty as well.  Patient said this multiple times the past few days.  No known diagnosis of gastroparesis/ulcer.  No history of seeing gastroenterology.  Patient denies any chest pain shortness of breath or fever.  No urinary symptoms.  Patient feels generally unwell since multiple episodes 7-8 since 7 this morning.  No abdominal surgery history.  No recent travel. Patient uses alcohol every few days 1-2 drinks.  Patient does not feel this is a problem for her.       No past medical history on file.  There are no problems to display for this patient.   No past surgical history on file.   OB History   No obstetric history on file.     Family History  Problem Relation Age of Onset   Healthy Mother    Heart attack Father     Social History   Tobacco Use   Smoking status: Former    Types: Cigarettes    Quit date: 06/23/2017    Years since quitting: 3.7   Smokeless tobacco: Never  Vaping Use   Vaping Use: Never used  Substance Use Topics   Alcohol use: Yes    Alcohol/week: 0.0 standard drinks   Drug use: Yes    Comment: marijuana    Home Medications Prior to Admission medications   Medication Sig Start Date End Date Taking? Authorizing Provider  DULoxetine (CYMBALTA) 30 MG capsule Take 30 mg daily by mouth. 06/20/17   [provider]  norethindrone (MICRONOR,CAMILA,ERRIN) 0.35 MG tablet Take 1 tablet daily by mouth. 06/20/17   [provider]  nortriptyline (PAMELOR) 10 MG capsule Take 1 capsule by mouth at bedtime. Dot Lanes 01/01/18   [provider]  promethazine-dextromethorphan (PROMETHAZINE-DM) 6.25-15 MG/5ML syrup Take 5 mLs by mouth 4 (four) times daily as needed. 06/16/20   Becky Augusta, NP    Allergies    Azithromycin  Review of Systems   Review of Systems  Constitutional:  Negative for chills and fever.  HENT:  Negative for congestion.   Eyes:  Negative for visual disturbance.  Respiratory:  Negative for shortness of breath.   Cardiovascular:  Negative for chest pain.  Gastrointestinal:  Positive for abdominal pain, diarrhea, nausea and vomiting.  Genitourinary:  Negative for dysuria and flank pain.  Musculoskeletal:  Negative for back pain, neck pain and neck stiffness.  Skin:  Negative for rash.  Neurological:  Positive for weakness and light-headedness. Negative for headaches.   Physical Exam Updated Vital Signs BP 128/86   Pulse 63   Resp 19   SpO2 100%   Physical Exam Vitals and nursing note reviewed.  Constitutional:      General: She is not in acute distress.    Appearance: She is well-developed.  HENT:     Head: Normocephalic and atraumatic.     Comments: Dry mm    Mouth/Throat:     Mouth: Mucous membranes are moist.  Eyes:     General:        Right eye: No discharge.  Left eye: No discharge.     Conjunctiva/sclera: Conjunctivae normal.  Neck:     Trachea: No tracheal deviation.  Cardiovascular:     Rate and Rhythm: Normal rate and regular rhythm.     Heart sounds: No murmur heard. Pulmonary:     Effort: Pulmonary effort is normal.     Breath sounds: Normal breath sounds.  Abdominal:     General: There is no distension.     Palpations: Abdomen is soft.     Tenderness: There is abdominal tenderness in the epigastric area. There is no guarding.  Musculoskeletal:     Cervical back: Normal range of motion and neck supple. No rigidity.  Skin:    General: Skin is warm.     Capillary Refill: Capillary refill takes less than 2 seconds.     Findings: No rash.  Neurological:      General: No focal deficit present.     Mental Status: She is alert.     Cranial Nerves: No cranial nerve deficit.  Psychiatric:        Mood and Affect: Mood normal.    ED Results / Procedures / Treatments   Labs (all labs ordered are listed, but only abnormal results are displayed) Labs Reviewed  RESP PANEL BY RT-PCR (FLU A&B, COVID) ARPGX2  COMPREHENSIVE METABOLIC PANEL  CBC WITH DIFFERENTIAL/PLATELET  ETHANOL  LIPASE, BLOOD  URINALYSIS, ROUTINE W REFLEX MICROSCOPIC  PREGNANCY, URINE    EKG EKG Interpretation  Date/Time:  Friday April 06 2021 10:14:14 EDT Ventricular Rate:  74 PR Interval:  140 QRS Duration: 95 QT Interval:  415 QTC Calculation: 461 R Axis:   -46 Text Interpretation: Sinus arrhythmia Left anterior fascicular block Low voltage, precordial leads Confirmed by Blane Ohara [phone]) on 04/06/2021 11:27:54 AM  Radiology No results found.  Procedures Procedures   Medications Ordered in ED Medications  sodium chloride 0.9 % bolus 1,000 mL (has no administration in time range)  metoCLOPramide (REGLAN) injection 10 mg (has no administration in time range)    ED Course  I have reviewed the triage vital signs and the nursing notes.  Pertinent labs & imaging results that were available during my care of the patient were reviewed by me and considered in my medical decision making (see chart for details).    MDM Rules/Calculators/A&P                           Patient presents with persistent nausea and vomiting since this morning.  Differential diagnosis including GB, gastritis/gastroenteritis, toxin mediated, ulcer, alcohol/drug-related, other.  Plan for blood work, IV fluids, antiemetics.  Difficult IV, IV team was able to place.  Patient's blood work started to return, leukocytosis 15,000 and pain worsening, persistent vomiting on reassessment.  Repeat IV fluid bolus and antiemetics ordered.  CT scan for further delineation.  Final Clinical  Impression(s) / ED Diagnoses Final diagnoses:  Vomiting in adult  Abdominal pain, generalized    Rx / DC Orders ED Discharge Orders     None        Blane Ohara, MD 04/10/21 [phone]

## 2021-04-06 NOTE — ED Notes (Signed)
Patient transported to CT 

## 2021-04-06 NOTE — ED Notes (Signed)
Pt is a difficult IV stick, no success for IV so far.

## 2021-04-06 NOTE — ED Triage Notes (Signed)
Pt from home arrived POV for c/o epigastric pain and vomiting/diarrhea since this am. Pt reports about 7-8 episode of vomiting. Pt reports generalized weakness, cold sweats and numb fingers and toes. Denies CP/SHOB at this time.

## 2021-04-06 NOTE — ED Provider Notes (Signed)
Pt signed out by Dr. Jodi Mourning pending CT.  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 No acute inflammatory findings.     5.3 cm right ovarian cyst, previously measuring up to 5.6 cm on  prior ultrasound, right ovarian torsion can not be excluded.    Pt has had a lot of problems with abdominal pain with diarrhea, but has never seen GI.  ? Crohn's.  Pt given solumedrol.  After fluids and meds, she is feeling much better.  She is tolerating po fluids.  She is d/c with prednisone, omeprazole, and zofran.  She is to return if worse.  Amb ref to GI given.    Jacalyn Lefevre, MD 04/06/21 [phone]

## 2021-04-07 ENCOUNTER — Emergency Department (HOSPITAL_COMMUNITY)
Admission: EM | Admit: 2021-04-07 | Discharge: 2021-04-07 | Disposition: A | Discharge status: Home / Self Care | Payer: Self-pay | Attending: Emergency Medicine | Admitting: Emergency Medicine

## 2021-04-07 ENCOUNTER — Emergency Department (HOSPITAL_COMMUNITY): Payer: Self-pay

## 2021-04-07 ENCOUNTER — Other Ambulatory Visit: Payer: Self-pay

## 2021-04-07 ENCOUNTER — Encounter (HOSPITAL_COMMUNITY): Payer: Self-pay | Admitting: Emergency Medicine

## 2021-04-07 ENCOUNTER — Inpatient Hospital Stay (HOSPITAL_COMMUNITY)
Admission: EM | Admit: 2021-04-07 | Discharge: 2021-04-12 | DRG: 382 | Disposition: A | Discharge status: Home / Self Care | Payer: Self-pay | Attending: Internal Medicine | Admitting: Internal Medicine

## 2021-04-07 DIAGNOSIS — Z91048 Other nonmedicinal substance allergy status: Secondary | ICD-10-CM

## 2021-04-07 DIAGNOSIS — Z20822 Contact with and (suspected) exposure to covid-19: Secondary | ICD-10-CM | POA: Diagnosis present

## 2021-04-07 DIAGNOSIS — Z87891 Personal history of nicotine dependence: Secondary | ICD-10-CM

## 2021-04-07 DIAGNOSIS — D72829 Elevated white blood cell count, unspecified: Secondary | ICD-10-CM | POA: Diagnosis present

## 2021-04-07 DIAGNOSIS — Z8249 Family history of ischemic heart disease and other diseases of the circulatory system: Secondary | ICD-10-CM

## 2021-04-07 DIAGNOSIS — E876 Hypokalemia: Secondary | ICD-10-CM | POA: Diagnosis present

## 2021-04-07 DIAGNOSIS — Z888 Allergy status to other drugs, medicaments and biological substances status: Secondary | ICD-10-CM

## 2021-04-07 DIAGNOSIS — K2951 Unspecified chronic gastritis with bleeding: Secondary | ICD-10-CM | POA: Diagnosis present

## 2021-04-07 DIAGNOSIS — F419 Anxiety disorder, unspecified: Secondary | ICD-10-CM | POA: Diagnosis present

## 2021-04-07 DIAGNOSIS — K219 Gastro-esophageal reflux disease without esophagitis: Secondary | ICD-10-CM | POA: Diagnosis present

## 2021-04-07 DIAGNOSIS — R112 Nausea with vomiting, unspecified: Secondary | ICD-10-CM | POA: Insufficient documentation

## 2021-04-07 DIAGNOSIS — Z6835 Body mass index (BMI) 35.0-35.9, adult: Secondary | ICD-10-CM

## 2021-04-07 DIAGNOSIS — R630 Anorexia: Secondary | ICD-10-CM | POA: Diagnosis present

## 2021-04-07 DIAGNOSIS — Z7952 Long term (current) use of systemic steroids: Secondary | ICD-10-CM

## 2021-04-07 DIAGNOSIS — Z79899 Other long term (current) drug therapy: Secondary | ICD-10-CM

## 2021-04-07 DIAGNOSIS — K2211 Ulcer of esophagus with bleeding: Principal | ICD-10-CM | POA: Diagnosis present

## 2021-04-07 DIAGNOSIS — E860 Dehydration: Secondary | ICD-10-CM | POA: Diagnosis present

## 2021-04-07 DIAGNOSIS — F1290 Cannabis use, unspecified, uncomplicated: Secondary | ICD-10-CM | POA: Diagnosis present

## 2021-04-07 DIAGNOSIS — F32A Depression, unspecified: Secondary | ICD-10-CM | POA: Diagnosis present

## 2021-04-07 DIAGNOSIS — Z5321 Procedure and treatment not carried out due to patient leaving prior to being seen by health care provider: Secondary | ICD-10-CM | POA: Insufficient documentation

## 2021-04-07 LAB — COMPREHENSIVE METABOLIC PANEL
ALT: 15 U/L (ref 0–44)
AST: 21 U/L (ref 15–41)
Albumin: 4.1 g/dL (ref 3.5–5.0)
Alkaline Phosphatase: 54 U/L (ref 38–126)
Anion gap: 11 (ref 5–15)
BUN: 8 mg/dL (ref 6–20)
CO2: 21 mmol/L — ABNORMAL LOW (ref 22–32)
Calcium: 9.4 mg/dL (ref 8.9–10.3)
Chloride: 104 mmol/L (ref 98–111)
Creatinine, Ser: 0.72 mg/dL (ref 0.44–1.00)
GFR, Estimated: >60 mL/min (ref >60)
Glucose, Bld: 121 mg/dL — ABNORMAL HIGH (ref 70–99)
Potassium: 3.5 mmol/L (ref 3.5–5.1)
Sodium: 136 mmol/L (ref 135–145)
Total Bilirubin: 1.5 mg/dL — ABNORMAL HIGH (ref 0.3–1.2)
Total Protein: 7.4 g/dL (ref 6.5–8.1)

## 2021-04-07 LAB — CBC WITH DIFFERENTIAL/PLATELET
Abs Immature Granulocytes: 0.22 10*3/uL — ABNORMAL HIGH (ref 0.00–0.07)
Basophils Absolute: 0.1 10*3/uL (ref 0.0–0.1)
Basophils Relative: 0 %
Eosinophils Absolute: 0 10*3/uL (ref 0.0–0.5)
Eosinophils Relative: 0 %
HCT: 43 % (ref 36.0–46.0)
Hemoglobin: 14.5 g/dL (ref 12.0–15.0)
Immature Granulocytes: 1 %
Lymphocytes Relative: 5 %
Lymphs Abs: 1.3 10*3/uL (ref 0.7–4.0)
MCH: 28.8 pg (ref 26.0–34.0)
MCHC: 33.7 g/dL (ref 30.0–36.0)
MCV: 85.3 fL (ref 80.0–100.0)
Monocytes Absolute: 2.1 10*3/uL — ABNORMAL HIGH (ref 0.1–1.0)
Monocytes Relative: 8 %
Neutro Abs: 23.1 10*3/uL — ABNORMAL HIGH (ref 1.7–7.7)
Neutrophils Relative %: 86 %
Platelets: 518 10*3/uL — ABNORMAL HIGH (ref 150–400)
RBC: 5.04 MIL/uL (ref 3.87–5.11)
RDW: 13.8 % (ref 11.5–15.5)
WBC: 26.8 10*3/uL — ABNORMAL HIGH (ref 4.0–10.5)
nRBC: 0 % (ref 0.0–0.2)

## 2021-04-07 LAB — I-STAT BETA HCG BLOOD, ED (MC, WL, AP ONLY): I-stat hCG, quantitative: <5 m[IU]/mL (ref <5)

## 2021-04-07 LAB — LIPASE, BLOOD: Lipase: 25 U/L (ref 11–51)

## 2021-04-07 MED ORDER — ONDANSETRON 4 MG PO TBDP
4.0000 mg | ORAL_TABLET | Freq: Once | ORAL | Status: AC
  Administered 2021-04-07: 4 mg via ORAL
  Filled 2021-04-07: qty 1

## 2021-04-07 NOTE — ED Notes (Signed)
Pt decided to leave 

## 2021-04-07 NOTE — ED Triage Notes (Signed)
Patient coming from home, complaint of n/v was seen for same yesterday, states she has not been able to keep medicine down. VSS. NAD.

## 2021-04-07 NOTE — ED Provider Notes (Signed)
Emergency Medicine Provider Triage Evaluation Note  Diana Randolph , a 30 y.o. female  was evaluated in triage.  Pt complains of abdominal pain nausea vomiting her symptoms have been ongoing since she last left the ER yesterday for the same symptoms.  States that she vomited up the Zofran that she tried to take does not seem to understand this is the third time.  Review of Systems  Positive: Abdominal pain nausea and vomiting Negative: Fever  Physical Exam  BP 140/86 (BP Location: Right Arm)   Pulse 69   Temp 98.5 F (36.9 C) (Oral)   Resp 16   SpO2 100%  Gen:   Awake, quite uncomfortable, tearful Resp:  Normal effort speaking in full sentences MSK:   Moves extremities without difficulty  Other:  No focal abdominal tenderness palpation.  Is endorsing discomfort with palpation of the entire abdomen.  Medical Decision Making  Medically screening exam initiated at 12:16 PM.  Appropriate orders placed.  Diana Randolph was informed that the remainder of the evaluation will be completed by another provider, this initial triage assessment does not replace that evaluation, and the importance of remaining in the ED until their evaluation is complete.  Patient states that she does use marijuana last night 3 days ago her symptoms are not improved since she was discharged in ER yesterday.   Diana Randolph, [place] 04/07/21 1218    Lorre Nick, MD 04/08/21 [phone]

## 2021-04-07 NOTE — ED Triage Notes (Signed)
Pt back for the third time in two days with c/o vomiting dark red bleed , was d/c home with scripts but states that they are not helping

## 2021-04-07 NOTE — ED Provider Notes (Signed)
Emergency Medicine Provider Triage Evaluation Note  Diana Randolph , a 30 y.o. female  was evaluated in triage.  Pt complains of bloody emesis. Seen yesterday and here today however eloped after labs. Multiple episode of dark emesis today. Last BM yesterday in ED, unsure if melena. CT yesterday with possible colitis. Given IV steroids. Has central lower CP and upper abd pain. Denies chronic NSAID use, etoh use. MJ use 3 days ago. No hx of PE, dvt, ulcer, gastroparesis.  Review of Systems  Positive: Bloody emesis, CP, abd pain Negative: Diarrhea, back pain  Physical Exam  There were no vitals taken for this visit. Gen:   Awake, no distress   Resp:  Normal effort  ABD:   diffuse abd tenderness worse to epigastric region MSK:   Moves extremities without difficulty  Other:    Medical Decision Making  Medically screening exam initiated at 11:00 PM.  Appropriate orders placed.  Diana Randolph was informed that the remainder of the evaluation will be completed by another provider, this initial triage assessment does not replace that evaluation, and the importance of remaining in the ED until their evaluation is complete.  Bloody emesis, CP, Abd pain   Kemyah Buser A, PA-C 04/07/21 2300    Tegeler, Canary Brim, MD 04/08/21 [phone]

## 2021-04-08 DIAGNOSIS — R112 Nausea with vomiting, unspecified: Secondary | ICD-10-CM

## 2021-04-08 LAB — CBC WITH DIFFERENTIAL/PLATELET
Abs Immature Granulocytes: 0.18 10*3/uL — ABNORMAL HIGH (ref 0.00–0.07)
Basophils Absolute: 0 10*3/uL (ref 0.0–0.1)
Basophils Relative: 0 %
Eosinophils Absolute: 0 10*3/uL (ref 0.0–0.5)
Eosinophils Relative: 0 %
HCT: 44.3 % (ref 36.0–46.0)
Hemoglobin: 14.5 g/dL (ref 12.0–15.0)
Immature Granulocytes: 1 %
Lymphocytes Relative: 4 %
Lymphs Abs: 0.9 10*3/uL (ref 0.7–4.0)
MCH: 28.4 pg (ref 26.0–34.0)
MCHC: 32.7 g/dL (ref 30.0–36.0)
MCV: 86.7 fL (ref 80.0–100.0)
Monocytes Absolute: 0.5 10*3/uL (ref 0.1–1.0)
Monocytes Relative: 2 %
Neutro Abs: 22.1 10*3/uL — ABNORMAL HIGH (ref 1.7–7.7)
Neutrophils Relative %: 93 %
Platelets: 518 10*3/uL — ABNORMAL HIGH (ref 150–400)
RBC: 5.11 MIL/uL (ref 3.87–5.11)
RDW: 13.7 % (ref 11.5–15.5)
WBC: 23.6 10*3/uL — ABNORMAL HIGH (ref 4.0–10.5)
nRBC: 0 % (ref 0.0–0.2)

## 2021-04-08 LAB — COMPREHENSIVE METABOLIC PANEL
ALT: 16 U/L (ref 0–44)
AST: 18 U/L (ref 15–41)
Albumin: 4.1 g/dL (ref 3.5–5.0)
Alkaline Phosphatase: 57 U/L (ref 38–126)
Anion gap: 12 (ref 5–15)
BUN: 10 mg/dL (ref 6–20)
CO2: 21 mmol/L — ABNORMAL LOW (ref 22–32)
Calcium: 9.4 mg/dL (ref 8.9–10.3)
Chloride: 103 mmol/L (ref 98–111)
Creatinine, Ser: 0.78 mg/dL (ref 0.44–1.00)
GFR, Estimated: >60 mL/min (ref >60)
Glucose, Bld: 138 mg/dL — ABNORMAL HIGH (ref 70–99)
Potassium: 3 mmol/L — ABNORMAL LOW (ref 3.5–5.1)
Sodium: 136 mmol/L (ref 135–145)
Total Bilirubin: 1.7 mg/dL — ABNORMAL HIGH (ref 0.3–1.2)
Total Protein: 7.5 g/dL (ref 6.5–8.1)

## 2021-04-08 LAB — RESP PANEL BY RT-PCR (FLU A&B, COVID) ARPGX2
Influenza A by PCR: NEGATIVE
Influenza B by PCR: NEGATIVE
SARS Coronavirus 2 by RT PCR: NEGATIVE

## 2021-04-08 LAB — RAPID URINE DRUG SCREEN, HOSP PERFORMED
Amphetamines: NOT DETECTED
Barbiturates: NOT DETECTED
Benzodiazepines: NOT DETECTED
Cocaine: NOT DETECTED
Opiates: POSITIVE — AB
Tetrahydrocannabinol: POSITIVE — AB

## 2021-04-08 LAB — LIPASE, BLOOD: Lipase: 25 U/L (ref 11–51)

## 2021-04-08 MED ORDER — PANTOPRAZOLE SODIUM 40 MG IV SOLR
40.0000 mg | Freq: Once | INTRAVENOUS | Status: AC
  Administered 2021-04-08: 40 mg via INTRAVENOUS
  Filled 2021-04-08: qty 40

## 2021-04-08 MED ORDER — PHENOL 1.4 % MT LIQD
1.0000 | OROMUCOSAL | Status: DC | PRN
  Filled 2021-04-08: qty 177

## 2021-04-08 MED ORDER — ACETAMINOPHEN 325 MG PO TABS
650.0000 mg | ORAL_TABLET | Freq: Four times a day (QID) | ORAL | Status: DC | PRN
  Administered 2021-04-09: 650 mg via ORAL
  Filled 2021-04-08 (×3): qty 2

## 2021-04-08 MED ORDER — METOCLOPRAMIDE HCL 5 MG/ML IJ SOLN
5.0000 mg | Freq: Once | INTRAMUSCULAR | Status: AC
  Administered 2021-04-08: 5 mg via INTRAVENOUS
  Filled 2021-04-08: qty 2

## 2021-04-08 MED ORDER — NORETHINDRONE 0.35 MG PO TABS
1.0000 | ORAL_TABLET | Freq: Every day | ORAL | Status: DC
  Administered 2021-04-11: 0.35 mg via ORAL

## 2021-04-08 MED ORDER — LACTATED RINGERS IV BOLUS
1000.0000 mL | Freq: Once | INTRAVENOUS | Status: AC
  Administered 2021-04-08: 1000 mL via INTRAVENOUS

## 2021-04-08 MED ORDER — ONDANSETRON HCL 4 MG PO TABS: 4.0000 mg | ORAL_TABLET | Freq: Four times a day (QID) | ORAL | Status: DC | PRN

## 2021-04-08 MED ORDER — METHYLPREDNISOLONE SODIUM SUCC 125 MG IJ SOLR
125.0000 mg | Freq: Once | INTRAMUSCULAR | Status: AC
  Administered 2021-04-08: 125 mg via INTRAVENOUS
  Filled 2021-04-08: qty 2

## 2021-04-08 MED ORDER — POTASSIUM CHLORIDE 10 MEQ/100ML IV SOLN
10.0000 meq | Freq: Once | INTRAVENOUS | Status: AC
  Administered 2021-04-08: 10 meq via INTRAVENOUS
  Filled 2021-04-08: qty 100

## 2021-04-08 MED ORDER — ACETAMINOPHEN 650 MG RE SUPP: 650.0000 mg | Freq: Four times a day (QID) | RECTAL | Status: DC | PRN

## 2021-04-08 MED ORDER — MORPHINE SULFATE (PF) 4 MG/ML IV SOLN
4.0000 mg | Freq: Once | INTRAVENOUS | Status: AC
  Administered 2021-04-08: 4 mg via INTRAVENOUS
  Filled 2021-04-08: qty 1

## 2021-04-08 MED ORDER — MORPHINE SULFATE (PF) 4 MG/ML IV SOLN
6.0000 mg | Freq: Once | INTRAVENOUS | Status: AC
  Administered 2021-04-08: 6 mg via INTRAVENOUS
  Filled 2021-04-08: qty 2

## 2021-04-08 MED ORDER — DULOXETINE HCL 30 MG PO CPEP
30.0000 mg | ORAL_CAPSULE | Freq: Every day | ORAL | Status: DC
  Administered 2021-04-08 – 2021-04-10 (×2): 30 mg via ORAL
  Filled 2021-04-08 (×5): qty 1

## 2021-04-08 MED ORDER — SODIUM CHLORIDE 0.9 % IV SOLN
12.5000 mg | Freq: Four times a day (QID) | INTRAVENOUS | Status: DC | PRN
  Administered 2021-04-08 – 2021-04-10 (×5): 12.5 mg via INTRAVENOUS
  Filled 2021-04-08: qty 12.5
  Filled 2021-04-08 (×3): qty 0.5

## 2021-04-08 MED ORDER — ONDANSETRON HCL 4 MG/2ML IJ SOLN
4.0000 mg | Freq: Four times a day (QID) | INTRAMUSCULAR | Status: DC | PRN
  Administered 2021-04-08 – 2021-04-09 (×2): 4 mg via INTRAVENOUS
  Filled 2021-04-08 (×2): qty 2

## 2021-04-08 MED ORDER — LACTATED RINGERS IV SOLN: INTRAVENOUS | Status: DC

## 2021-04-08 MED ORDER — PANTOPRAZOLE SODIUM 40 MG IV SOLR
40.0000 mg | INTRAVENOUS | Status: DC
  Administered 2021-04-08: 40 mg via INTRAVENOUS
  Filled 2021-04-08: qty 40

## 2021-04-08 MED ORDER — ONDANSETRON 4 MG PO TBDP
4.0000 mg | ORAL_TABLET | Freq: Once | ORAL | Status: AC | PRN
  Administered 2021-04-08: 4 mg via ORAL
  Filled 2021-04-08: qty 1

## 2021-04-08 MED ORDER — POTASSIUM CHLORIDE 10 MEQ/100ML IV SOLN
10.0000 meq | INTRAVENOUS | Status: AC
  Administered 2021-04-08: 10 meq via INTRAVENOUS
  Filled 2021-04-08: qty 100

## 2021-04-08 MED ORDER — NORTRIPTYLINE HCL 10 MG PO CAPS
10.0000 mg | ORAL_CAPSULE | Freq: Every day | ORAL | Status: DC
  Administered 2021-04-09: 10 mg via ORAL
  Filled 2021-04-08 (×5): qty 1

## 2021-04-08 MED ORDER — NORETHINDRONE 0.35 MG PO TABS: 1.0000 | ORAL_TABLET | Freq: Every day | ORAL | Status: DC

## 2021-04-08 NOTE — Progress Notes (Signed)
NEW ADMISSION NOTE New Admission Note:   Arrival Method:  Stretcher  Mental Orientation:  A&O x4 Telemetry: not ordered Assessment: Completed Skin: Intact  IV: Left hand infusing Pain: 5 1-10 scale Tubes:none Safety Measures: Safety Fall Prevention Plan has been given, discussed and signed Admission: Completed [facility] Orientation: Patient has been orientated to the room, unit and staff.  Family: Mother and significant other bedside  Orders have been reviewed and implemented. Will continue to monitor the patient. Call light has been placed within reach and bed alarm has been activated.   Velia Meyer, RN

## 2021-04-08 NOTE — H&P (View-Only) (Signed)
[facility] UNASSIGNED CONSULT FOR  GI  Reason for Consult: Nausea, vomiting, GERD, hematemesis, and abnormal CT scan. Referring Physician: [facility]  Rosamaria Lints HPI: This is a 30 year old female without any significant PMH admitted for persistent nausea, vomiting, epigastric pain, and hematemesis.  Her symptoms started acutely at 7 AM on Friday and she presented to the ER.  Work up did not reveal a clear cut source for the symptoms, but there was evidence of fatty mural stratification in the TI, ascending, transverse, and descending colon.  These were chronic and nonspecific findings and she was emperically treated with a dose of Solumedrol.  With the dosing she reportedly felt better, but she wanted to be discharged home.  She was able to suppress her symptoms, but she started to vomit profusely when she was driving home.  Her symptoms never truly abated or resolved.  The patient represented to the ER on Saturday, but she did not stay to be evaluated again.  She returned today as her symptoms continued to persist.  With this vomiting she reported a significant amount of hematemesis, but her HGB was stable.  During first ER visit her WBC was noted to be at 16.5 and it increased with these subsequent visits, however, she was provided with oral steroids at home as well as a PPI.  It was unclear how much of the steroids she was able to keep down.  No past medical history on file.  No past surgical history on file.  Family History  Problem Relation Age of Onset   Healthy Mother    Heart attack Father     Social History:  reports that she quit smoking about 3 years ago. She has never used smokeless tobacco. She reports current alcohol use. She reports current drug use.  Allergies:  Allergies  Allergen Reactions   Azithromycin Diarrhea   Tape Itching and Other (See Comments)    Adhesive    Buspirone Palpitations    Medications: Scheduled:  DULoxetine  30 mg Oral Daily    nortriptyline  10 mg Oral QHS   pantoprazole (PROTONIX) IV  40 mg Intravenous Q24H   Continuous:  lactated ringers     potassium chloride      Results for orders placed or performed during the hospital encounter of 04/07/21 (from the past 24 hour(s))  CBC with Differential     Status: Abnormal   Collection Time: 04/08/21 12:42 AM  Result Value Ref Range   WBC 23.6 (H) 4.0 - 10.5 K/uL   RBC 5.11 3.87 - 5.11 MIL/uL   Hemoglobin 14.5 12.0 - 15.0 g/dL   HCT 84.1 66.0 - 63.0 %   MCV 86.7 80.0 - 100.0 fL   MCH 28.4 26.0 - 34.0 pg   MCHC 32.7 30.0 - 36.0 g/dL   RDW 16.0 10.9 - 32.3 %   Platelets 518 (H) 150 - 400 K/uL   nRBC 0.0 0.0 - 0.2 %   Neutrophils Relative % 93 %   Neutro Abs 22.1 (H) 1.7 - 7.7 K/uL   Lymphocytes Relative 4 %   Lymphs Abs 0.9 0.7 - 4.0 K/uL   Monocytes Relative 2 %   Monocytes Absolute 0.5 0.1 - 1.0 K/uL   Eosinophils Relative 0 %   Eosinophils Absolute 0.0 0.0 - 0.5 K/uL   Basophils Relative 0 %   Basophils Absolute 0.0 0.0 - 0.1 K/uL   Immature Granulocytes 1 %   Abs Immature Granulocytes 0.18 (H) 0.00 - 0.07 K/uL  Comprehensive metabolic panel     Status: Abnormal   Collection Time: 04/08/21 12:42 AM  Result Value Ref Range   Sodium 136 135 - 145 mmol/L   Potassium 3.0 (L) 3.5 - 5.1 mmol/L   Chloride 103 98 - 111 mmol/L   CO2 21 (L) 22 - 32 mmol/L   Glucose, Bld 138 (H) 70 - 99 mg/dL   BUN 10 6 - 20 mg/dL   Creatinine, Ser 5.46 0.44 - 1.00 mg/dL   Calcium 9.4 8.9 - 27.0 mg/dL   Total Protein 7.5 6.5 - 8.1 g/dL   Albumin 4.1 3.5 - 5.0 g/dL   AST 18 15 - 41 U/L   ALT 16 0 - 44 U/L   Alkaline Phosphatase 57 38 - 126 U/L   Total Bilirubin 1.7 (H) 0.3 - 1.2 mg/dL   GFR, Estimated >35 >00 mL/min   Anion gap 12 5 - 15  Lipase, blood     Status: None   Collection Time: 04/08/21  9:27 AM  Result Value Ref Range   Lipase 25 11 - 51 U/L  Urine rapid drug screen (hosp performed)     Status: Abnormal   Collection Time: 04/08/21  1:46 PM  Result Value  Ref Range   Opiates POSITIVE (A) NONE DETECTED   Cocaine NONE DETECTED NONE DETECTED   Benzodiazepines NONE DETECTED NONE DETECTED   Amphetamines NONE DETECTED NONE DETECTED   Tetrahydrocannabinol POSITIVE (A) NONE DETECTED   Barbiturates NONE DETECTED NONE DETECTED     CT ABDOMEN PELVIS W CONTRAST  Result Date: 04/06/2021 CLINICAL DATA:  Abdominal pain, acute, nonlocalized pain EXAM: CT ABDOMEN AND PELVIS WITH CONTRAST TECHNIQUE: Multidetector CT imaging of the abdomen and pelvis was performed using the standard protocol following bolus administration of intravenous contrast. CONTRAST:  OMNIPAQUE IOHEXOL 350 MG/ML SOLN COMPARISON:  None. FINDINGS: Lower chest: No acute abnormality. Hepatobiliary: No focal liver abnormality is seen. No gallstones, gallbladder wall thickening, or biliary dilatation. Pancreas: Unremarkable. No pancreatic ductal dilatation or surrounding inflammatory changes. Spleen: Normal in size without focal abnormality. Adrenals/Urinary Tract: Adrenal glands are unremarkable. Kidneys are normal, without renal calculi, focal lesion, or hydronephrosis. Bladder is unremarkable. Stomach/Bowel: Stomach is unremarkable. There is no evidence of bowel obstruction. The appendix is normal. There is submucosal fat deposition within the terminal ileum, ascending colon, transverse colon, and descending colon. Sparing of the rectosigmoid colon. There is no adjacent inflammatory stranding. Vascular/Lymphatic: No significant vascular findings are present. No enlarged abdominal or pelvic lymph nodes. Reproductive: There is a 5.3 x 5.1x 5.1 cm right ovarian cyst, previously measuring up to 5.6 cm on prior ultrasound in June. Other: No hernia.  No abdominopelvic ascites. Musculoskeletal: No acute or significant osseous findings. IMPRESSION: Fatty mural stratification within the terminal ileum, ascending, transverse, and descending colon. Findings suggest chronic sequela of nonspecific  colitis/terminal ileitis, particularly Crohn's disease given this distribution, if clinically relevant signs and symptoms are present. No acute inflammatory findings. 5.3 cm right ovarian cyst, previously measuring up to 5.6 cm on prior ultrasound, right ovarian torsion can not be excluded. Electronically Signed   By: Caprice Renshaw M.D.   On: 04/06/2021 15:57   DG Abdomen Acute W/Chest  Result Date: 04/07/2021 CLINICAL DATA:  Vomiting and abdominal pain. EXAM: DG ABDOMEN ACUTE WITH 1 VIEW CHEST COMPARISON:  None. FINDINGS: There is no evidence of dilated bowel loops or free intraperitoneal air. No radiopaque calculi or other significant radiographic abnormality is seen. Heart size and mediastinal contours are within normal  limits. Both lungs are clear. IMPRESSION: Negative abdominal radiographs.  No acute cardiopulmonary disease. Electronically Signed   By: Aram Candela M.D.   On: 04/07/2021 23:39    ROS:  As stated above in the HPI otherwise negative.  Blood pressure 127/84, pulse 61, temperature 98.1 F (36.7 C), temperature source Oral, resp. rate 14, SpO2 100 %.    PE: Gen: NAD, Alert and Oriented HEENT:  [place]/AT, EOMI Neck: Supple, no LAD Lungs: CTA Bilaterally CV: RRR without M/G/R ABD: Soft, NTND, +BS Ext: No C/C/E  Assessment/Plan: 1) Persistent nausea/vomiting. 2) Hematemesis. 3) ? Crohn's disease.   With the persistent N/V and hematemesis, an EGD will be performed.  She states that she is experiencing GERD.  As for the possibility of Crohn's disease, this can be evaluated after her EGD.  She cannot tolerate a prep at this time.  Plan: 1) EGD tomorrow with Dr. Larae Grooms. 2) Continue pantoprazole.  Josaphine Shimamoto D 04/08/2021, 2:40 PM

## 2021-04-08 NOTE — Plan of Care (Signed)
  Problem: Nutrition: Goal: Adequate nutrition will be maintained Outcome: Not Progressing   Problem: Coping: Goal: Level of anxiety will decrease Outcome: Not Progressing   

## 2021-04-08 NOTE — H&P (Signed)
History and Physical    Diana Randolph RWE:[id number] DOB: 03-12-1991 DOA: 04/07/2021  PCP: Loura Pardon, NP (Confirm with patient/family/NH records and if not entered, this has to be entered at [facility] point of entry) Patient coming from: Home  I have personally briefly reviewed patient's old medical records in [facility]  Chief Complaint: Nausea vomiting  HPI: Diana Randolph is a 30 y.o. female with medical history significant of anxiety/depression, morbid obesity, came with persistent nausea/vomiting and abdominal pain.  Symptoms started tow days ago, she woke up with severe feeling of nausea and vomited 1 time stomach content. She initially thought it was the food she ate the night prior, but over the day, she continued to have constant feeling of nausea and vomited 3-4 times with dark colored content and started to have epigastric abdominal pain. No fever, no diarrhea.   Patient then came to ED on Friday and CT scan suspicious for Crohn's disease, and patient was given supportive meds and sent home with PO prednisone. Yesterday, patient took one time PPI and one dose of steroid ,but then started to have nausea and vomiting again throughout the day.  ED Course: Failed PO challenge, continue to have nausea and vomiting.  Review of Systems: As per HPI otherwise 14 point review of systems negative.    No past medical history on file.  No past surgical history on file.   reports that she quit smoking about 3 years ago. She has never used smokeless tobacco. She reports current alcohol use. She reports current drug use.  Allergies  Allergen Reactions   Azithromycin Diarrhea   Tape Itching and Other (See Comments)    Adhesive    Buspirone Palpitations    Family History  Problem Relation Age of Onset   Healthy Mother    Heart attack Father      Prior to Admission medications   Medication Sig Start Date End Date Taking? Authorizing Provider  DULoxetine (CYMBALTA) 30 MG  capsule Take 30 mg daily by mouth. 06/20/17   [provider]  norethindrone (MICRONOR,CAMILA,ERRIN) 0.35 MG tablet Take 1 tablet daily by mouth. 06/20/17   [provider]  omeprazole (PRILOSEC) 20 MG capsule Take 1 capsule (20 mg total) by mouth daily. 04/06/21   Jacalyn Lefevre, MD  ondansetron (ZOFRAN ODT) 4 MG disintegrating tablet Take 1 tablet (4 mg total) by mouth every 8 (eight) hours as needed for nausea or vomiting. 04/06/21   Jacalyn Lefevre, MD  predniSONE (STERAPRED UNI-PAK 21 TAB) 10 MG (21) TBPK tablet Take by mouth daily. Take 6 tabs by mouth daily  for 2 days, then 5 tabs for 2 days, then 4 tabs for 2 days, then 3 tabs for 2 days, 2 tabs for 2 days, then 1 tab by mouth daily for 2 days 04/06/21   Jacalyn Lefevre, MD  promethazine-dextromethorphan (PROMETHAZINE-DM) 6.25-15 MG/5ML syrup Take 5 mLs by mouth 4 (four) times daily as needed. 06/16/20   Becky Augusta, NP    Physical Exam: Vitals:   04/08/21 1245 04/08/21 1300 04/08/21 1315 04/08/21 1330  BP: (!) 150/91 (!) 149/89 124/69 127/84  Pulse: 61 64 (!) 57 61  Resp: 15 16 20 14   Temp:      TempSrc:      SpO2: 100% 100% 97% 100%    Constitutional: NAD, calm, comfortable Vitals:   04/08/21 1245 04/08/21 1300 04/08/21 1315 04/08/21 1330  BP: (!) 150/91 (!) 149/89 124/69 127/84  Pulse: 61 64 (!) 57 61  Resp: 15 16 20 14   Temp:      TempSrc:      SpO2: 100% 100% 97% 100%   Eyes: PERRL, lids and conjunctivae normal ENMT: Mucous membranes are dry. Posterior pharynx clear of any exudate or lesions.Normal dentition.  Neck: normal, supple, no masses, no thyromegaly Respiratory: clear to auscultation bilaterally, no wheezing, no crackles. Normal respiratory effort. No accessory muscle use.  Cardiovascular: Regular rate and rhythm, no murmurs / rubs / gallops. No extremity edema. 2+ pedal pulses. No carotid bruits.  Abdomen: mild tenderness on epigastric area, no rebound or guarding, no masses palpated. No  hepatosplenomegaly. Bowel sounds positive.  Musculoskeletal: no clubbing / cyanosis. No joint deformity upper and lower extremities. Good ROM, no contractures. Normal muscle tone.  Skin: no rashes, lesions, ulcers. No induration Neurologic: CN 2-12 grossly intact. Sensation intact, DTR normal. Strength 5/5 in all 4.  Psychiatric: Normal judgment and insight. Alert and oriented x 3. Normal mood.     Labs on Admission: I have personally reviewed following labs and imaging studies  CBC: Recent Labs  Lab 04/06/21 1225 04/07/21 1225 04/08/21 0042  WBC 16.5* 26.8* 23.6*  NEUTROABS 14.4* 23.1* 22.1*  HGB 15.8* 14.5 14.5  HCT 47.0* 43.0 44.3  MCV 85.8 85.3 86.7  PLT 513* 518* 518*   Basic Metabolic Panel: Recent Labs  Lab 04/06/21 1225 04/07/21 1225 04/08/21 0042  NA 136 136 136  K 3.8 3.5 3.0*  CL 105 104 103  CO2 19* 21* 21*  GLUCOSE 173* 121* 138*  BUN 10 8 10   CREATININE 0.81 0.72 0.78  CALCIUM 9.4 9.4 9.4   GFR: CrCl cannot be calculated (Unknown ideal weight.). Liver Function Tests: Recent Labs  Lab 04/06/21 1225 04/07/21 1225 04/08/21 0042  AST 24 21 18   ALT 11 15 16   ALKPHOS 51 54 57  BILITOT 1.4* 1.5* 1.7*  PROT 7.1 7.4 7.5  ALBUMIN 4.1 4.1 4.1   Recent Labs  Lab 04/06/21 1225 04/07/21 1225 04/08/21 0927  LIPASE 26 25 25    No results for input(s): AMMONIA in the last 168 hours. Coagulation Profile: No results for input(s): INR, PROTIME in the last 168 hours. Cardiac Enzymes: No results for input(s): CKTOTAL, CKMB, CKMBINDEX, TROPONINI in the last 168 hours. BNP (last 3 results) No results for input(s): PROBNP in the last 8760 hours. HbA1C: No results for input(s): HGBA1C in the last 72 hours. CBG: No results for input(s): GLUCAP in the last 168 hours. Lipid Profile: No results for input(s): CHOL, HDL, LDLCALC, TRIG, CHOLHDL, LDLDIRECT in the last 72 hours. Thyroid Function Tests: No results for input(s): TSH, T4TOTAL, FREET4, T3FREE,  THYROIDAB in the last 72 hours. Anemia Panel: No results for input(s): VITAMINB12, FOLATE, FERRITIN, TIBC, IRON, RETICCTPCT in the last 72 hours. Urine analysis:    Component Value Date/Time   COLORURINE YELLOW 04/06/2021 1104   APPEARANCEUR CLEAR 04/06/2021 1104   LABSPEC 1.025 04/06/2021 1104   PHURINE 8.0 04/06/2021 1104   GLUCOSEU NEGATIVE 04/06/2021 1104   HGBUR NEGATIVE 04/06/2021 1104   BILIRUBINUR NEGATIVE 04/06/2021 1104   BILIRUBINUR negative 10/26/2015 1526   KETONESUR >80 (A) 04/06/2021 1104   PROTEINUR NEGATIVE 04/06/2021 1104   UROBILINOGEN 0.2 10/26/2015 1526   NITRITE NEGATIVE 04/06/2021 1104   LEUKOCYTESUR TRACE (A) 04/06/2021 1104    Radiological Exams on Admission: CT ABDOMEN PELVIS W CONTRAST  Result Date: 04/06/2021 CLINICAL DATA:  Abdominal pain, acute, nonlocalized pain EXAM: CT ABDOMEN AND PELVIS WITH CONTRAST TECHNIQUE: Multidetector CT imaging of  the abdomen and pelvis was performed using the standard protocol following bolus administration of intravenous contrast. CONTRAST:  OMNIPAQUE IOHEXOL 350 MG/ML SOLN COMPARISON:  None. FINDINGS: Lower chest: No acute abnormality. Hepatobiliary: No focal liver abnormality is seen. No gallstones, gallbladder wall thickening, or biliary dilatation. Pancreas: Unremarkable. No pancreatic ductal dilatation or surrounding inflammatory changes. Spleen: Normal in size without focal abnormality. Adrenals/Urinary Tract: Adrenal glands are unremarkable. Kidneys are normal, without renal calculi, focal lesion, or hydronephrosis. Bladder is unremarkable. Stomach/Bowel: Stomach is unremarkable. There is no evidence of bowel obstruction. The appendix is normal. There is submucosal fat deposition within the terminal ileum, ascending colon, transverse colon, and descending colon. Sparing of the rectosigmoid colon. There is no adjacent inflammatory stranding. Vascular/Lymphatic: No significant vascular findings are present. No enlarged  abdominal or pelvic lymph nodes. Reproductive: There is a 5.3 x 5.1x 5.1 cm right ovarian cyst, previously measuring up to 5.6 cm on prior ultrasound in June. Other: No hernia.  No abdominopelvic ascites. Musculoskeletal: No acute or significant osseous findings. IMPRESSION: Fatty mural stratification within the terminal ileum, ascending, transverse, and descending colon. Findings suggest chronic sequela of nonspecific colitis/terminal ileitis, particularly Crohn's disease given this distribution, if clinically relevant signs and symptoms are present. No acute inflammatory findings. 5.3 cm right ovarian cyst, previously measuring up to 5.6 cm on prior ultrasound, right ovarian torsion can not be excluded. Electronically Signed   By: Caprice Renshaw M.D.   On: 04/06/2021 15:57   DG Abdomen Acute W/Chest  Result Date: 04/07/2021 CLINICAL DATA:  Vomiting and abdominal pain. EXAM: DG ABDOMEN ACUTE WITH 1 VIEW CHEST COMPARISON:  None. FINDINGS: There is no evidence of dilated bowel loops or free intraperitoneal air. No radiopaque calculi or other significant radiographic abnormality is seen. Heart size and mediastinal contours are within normal limits. Both lungs are clear. IMPRESSION: Negative abdominal radiographs.  No acute cardiopulmonary disease. Electronically Signed   By: Aram Candela M.D.   On: 04/07/2021 23:39    EKG: Independently reviewed. Sinus.  Assessment/Plan Active Problems:   Nausea & vomiting   Intractable nausea and vomiting  (please populate well all problems here in Problem List. (For example, if patient is on BP meds at home and you resume or decide to hold them, it is a problem that needs to be her. Same for CAD, COPD, HLD and so on)  Intractable nausea and vomiting -Etiology unknown. Differential can be Crohn's disease, Celiac, all based on CT abd findings. D/W oncall GI Dr. Elnoria Howard, who will come to see the patient today. -Supportive care with IVF and PRN Zofran. -Now, without  tissue diagnosis of Crohns, will hold off streroid. -UDS  Leukocytosis -Likely reactive to GI symptoms as well as acute steroid use -Monitor off ABX.  Dehydration -Maintenance IVF   Anxiety/Depression -Continue SSRI  Morbid obesity -Calorie control.  DVT prophylaxis: SCD Code Status: Full Code Family Communication: Mother at bedside Disposition Plan: Expect more than 2 night hospital stay to allow GI workup. Consults called: GI Dr. Elnoria Howard Admission status: MedSurg admit   Emeline General MD [facility] Pager [phone]  04/08/2021, 1:44 PM

## 2021-04-08 NOTE — Consult Note (Signed)
[facility] UNASSIGNED CONSULT FOR  GI  Reason for Consult: Nausea, vomiting, GERD, hematemesis, and abnormal CT scan. Referring Physician: [facility]  Rosamaria Lints HPI: This is a 30 year old female without any significant PMH admitted for persistent nausea, vomiting, epigastric pain, and hematemesis.  Her symptoms started acutely at 7 AM on Friday and she presented to the ER.  Work up did not reveal a clear cut source for the symptoms, but there was evidence of fatty mural stratification in the TI, ascending, transverse, and descending colon.  These were chronic and nonspecific findings and she was emperically treated with a dose of Solumedrol.  With the dosing she reportedly felt better, but she wanted to be discharged home.  She was able to suppress her symptoms, but she started to vomit profusely when she was driving home.  Her symptoms never truly abated or resolved.  The patient represented to the ER on Saturday, but she did not stay to be evaluated again.  She returned today as her symptoms continued to persist.  With this vomiting she reported a significant amount of hematemesis, but her HGB was stable.  During first ER visit her WBC was noted to be at 16.5 and it increased with these subsequent visits, however, she was provided with oral steroids at home as well as a PPI.  It was unclear how much of the steroids she was able to keep down.  No past medical history on file.  No past surgical history on file.  Family History  Problem Relation Age of Onset   Healthy Mother    Heart attack Father     Social History:  reports that she quit smoking about 3 years ago. She has never used smokeless tobacco. She reports current alcohol use. She reports current drug use.  Allergies:  Allergies  Allergen Reactions   Azithromycin Diarrhea   Tape Itching and Other (See Comments)    Adhesive    Buspirone Palpitations    Medications: Scheduled:  DULoxetine  30 mg Oral Daily    nortriptyline  10 mg Oral QHS   pantoprazole (PROTONIX) IV  40 mg Intravenous Q24H   Continuous:  lactated ringers     potassium chloride      Results for orders placed or performed during the hospital encounter of 04/07/21 (from the past 24 hour(s))  CBC with Differential     Status: Abnormal   Collection Time: 04/08/21 12:42 AM  Result Value Ref Range   WBC 23.6 (H) 4.0 - 10.5 K/uL   RBC 5.11 3.87 - 5.11 MIL/uL   Hemoglobin 14.5 12.0 - 15.0 g/dL   HCT 84.1 66.0 - 63.0 %   MCV 86.7 80.0 - 100.0 fL   MCH 28.4 26.0 - 34.0 pg   MCHC 32.7 30.0 - 36.0 g/dL   RDW 16.0 10.9 - 32.3 %   Platelets 518 (H) 150 - 400 K/uL   nRBC 0.0 0.0 - 0.2 %   Neutrophils Relative % 93 %   Neutro Abs 22.1 (H) 1.7 - 7.7 K/uL   Lymphocytes Relative 4 %   Lymphs Abs 0.9 0.7 - 4.0 K/uL   Monocytes Relative 2 %   Monocytes Absolute 0.5 0.1 - 1.0 K/uL   Eosinophils Relative 0 %   Eosinophils Absolute 0.0 0.0 - 0.5 K/uL   Basophils Relative 0 %   Basophils Absolute 0.0 0.0 - 0.1 K/uL   Immature Granulocytes 1 %   Abs Immature Granulocytes 0.18 (H) 0.00 - 0.07 K/uL  Comprehensive metabolic panel     Status: Abnormal   Collection Time: 04/08/21 12:42 AM  Result Value Ref Range   Sodium 136 135 - 145 mmol/L   Potassium 3.0 (L) 3.5 - 5.1 mmol/L   Chloride 103 98 - 111 mmol/L   CO2 21 (L) 22 - 32 mmol/L   Glucose, Bld 138 (H) 70 - 99 mg/dL   BUN 10 6 - 20 mg/dL   Creatinine, Ser 5.46 0.44 - 1.00 mg/dL   Calcium 9.4 8.9 - 27.0 mg/dL   Total Protein 7.5 6.5 - 8.1 g/dL   Albumin 4.1 3.5 - 5.0 g/dL   AST 18 15 - 41 U/L   ALT 16 0 - 44 U/L   Alkaline Phosphatase 57 38 - 126 U/L   Total Bilirubin 1.7 (H) 0.3 - 1.2 mg/dL   GFR, Estimated >35 >00 mL/min   Anion gap 12 5 - 15  Lipase, blood     Status: None   Collection Time: 04/08/21  9:27 AM  Result Value Ref Range   Lipase 25 11 - 51 U/L  Urine rapid drug screen (hosp performed)     Status: Abnormal   Collection Time: 04/08/21  1:46 PM  Result Value  Ref Range   Opiates POSITIVE (A) NONE DETECTED   Cocaine NONE DETECTED NONE DETECTED   Benzodiazepines NONE DETECTED NONE DETECTED   Amphetamines NONE DETECTED NONE DETECTED   Tetrahydrocannabinol POSITIVE (A) NONE DETECTED   Barbiturates NONE DETECTED NONE DETECTED     CT ABDOMEN PELVIS W CONTRAST  Result Date: 04/06/2021 CLINICAL DATA:  Abdominal pain, acute, nonlocalized pain EXAM: CT ABDOMEN AND PELVIS WITH CONTRAST TECHNIQUE: Multidetector CT imaging of the abdomen and pelvis was performed using the standard protocol following bolus administration of intravenous contrast. CONTRAST:  OMNIPAQUE IOHEXOL 350 MG/ML SOLN COMPARISON:  None. FINDINGS: Lower chest: No acute abnormality. Hepatobiliary: No focal liver abnormality is seen. No gallstones, gallbladder wall thickening, or biliary dilatation. Pancreas: Unremarkable. No pancreatic ductal dilatation or surrounding inflammatory changes. Spleen: Normal in size without focal abnormality. Adrenals/Urinary Tract: Adrenal glands are unremarkable. Kidneys are normal, without renal calculi, focal lesion, or hydronephrosis. Bladder is unremarkable. Stomach/Bowel: Stomach is unremarkable. There is no evidence of bowel obstruction. The appendix is normal. There is submucosal fat deposition within the terminal ileum, ascending colon, transverse colon, and descending colon. Sparing of the rectosigmoid colon. There is no adjacent inflammatory stranding. Vascular/Lymphatic: No significant vascular findings are present. No enlarged abdominal or pelvic lymph nodes. Reproductive: There is a 5.3 x 5.1x 5.1 cm right ovarian cyst, previously measuring up to 5.6 cm on prior ultrasound in June. Other: No hernia.  No abdominopelvic ascites. Musculoskeletal: No acute or significant osseous findings. IMPRESSION: Fatty mural stratification within the terminal ileum, ascending, transverse, and descending colon. Findings suggest chronic sequela of nonspecific  colitis/terminal ileitis, particularly Crohn's disease given this distribution, if clinically relevant signs and symptoms are present. No acute inflammatory findings. 5.3 cm right ovarian cyst, previously measuring up to 5.6 cm on prior ultrasound, right ovarian torsion can not be excluded. Electronically Signed   By: Caprice Renshaw M.D.   On: 04/06/2021 15:57   DG Abdomen Acute W/Chest  Result Date: 04/07/2021 CLINICAL DATA:  Vomiting and abdominal pain. EXAM: DG ABDOMEN ACUTE WITH 1 VIEW CHEST COMPARISON:  None. FINDINGS: There is no evidence of dilated bowel loops or free intraperitoneal air. No radiopaque calculi or other significant radiographic abnormality is seen. Heart size and mediastinal contours are within normal  limits. Both lungs are clear. IMPRESSION: Negative abdominal radiographs.  No acute cardiopulmonary disease. Electronically Signed   By: Aram Candela M.D.   On: 04/07/2021 23:39    ROS:  As stated above in the HPI otherwise negative.  Blood pressure 127/84, pulse 61, temperature 98.1 F (36.7 C), temperature source Oral, resp. rate 14, SpO2 100 %.    PE: Gen: NAD, Alert and Oriented HEENT:  [place]/AT, EOMI Neck: Supple, no LAD Lungs: CTA Bilaterally CV: RRR without M/G/R ABD: Soft, NTND, +BS Ext: No C/C/E  Assessment/Plan: 1) Persistent nausea/vomiting. 2) Hematemesis. 3) ? Crohn's disease.   With the persistent N/V and hematemesis, an EGD will be performed.  She states that she is experiencing GERD.  As for the possibility of Crohn's disease, this can be evaluated after her EGD.  She cannot tolerate a prep at this time.  Plan: 1) EGD tomorrow with Dr. Larae Grooms. 2) Continue pantoprazole.  Diana Randolph D 04/08/2021, 2:40 PM

## 2021-04-08 NOTE — ED Provider Notes (Signed)
[facility] EMERGENCY DEPARTMENT Provider Note   CSN: [id number] Arrival date & time: 04/07/21  2247     History No chief complaint on file.   Diana Randolph is a 30 y.o. female.  30 year old female who presents with recurrent epigastric pain with associated emesis.  States that she noted some blood streaked in her vomitus.  Has been seen here recently for same.  Has been having abdominal discomfort quite some time.  Concern for possible Crohn's disease after results of CAT scan which was done 2 days ago.  She denies any fever.  Has not had any black or bloody stools.  Pain is been persistent and goes up into her chest and feels a burning sensation.  Has been able to keep down her current medications.  Denies any recent fevers      No past medical history on file.  There are no problems to display for this patient.   No past surgical history on file.   OB History   No obstetric history on file.     Family History  Problem Relation Age of Onset   Healthy Mother    Heart attack Father     Social History   Tobacco Use   Smoking status: Former    Types: Cigarettes    Quit date: 06/23/2017    Years since quitting: 3.7   Smokeless tobacco: Never  Vaping Use   Vaping Use: Never used  Substance Use Topics   Alcohol use: Yes    Alcohol/week: 0.0 standard drinks   Drug use: Yes    Comment: marijuana    Home Medications Prior to Admission medications   Medication Sig Start Date End Date Taking? Authorizing Provider  DULoxetine (CYMBALTA) 30 MG capsule Take 30 mg daily by mouth. 06/20/17   [provider]  norethindrone (MICRONOR,CAMILA,ERRIN) 0.35 MG tablet Take 1 tablet daily by mouth. 06/20/17   [provider]  nortriptyline (PAMELOR) 10 MG capsule Take 1 capsule by mouth at bedtime. [place] 01/01/18   [provider]  omeprazole (PRILOSEC) 20 MG capsule Take 1 capsule (20 mg total) by mouth daily. 04/06/21   Jacalyn Lefevre, MD  ondansetron (ZOFRAN ODT) 4 MG disintegrating tablet Take 1 tablet (4 mg total) by mouth every 8 (eight) hours as needed for nausea or vomiting. 04/06/21   Jacalyn Lefevre, MD  predniSONE (STERAPRED UNI-PAK 21 TAB) 10 MG (21) TBPK tablet Take by mouth daily. Take 6 tabs by mouth daily  for 2 days, then 5 tabs for 2 days, then 4 tabs for 2 days, then 3 tabs for 2 days, 2 tabs for 2 days, then 1 tab by mouth daily for 2 days 04/06/21   Jacalyn Lefevre, MD  promethazine-dextromethorphan (PROMETHAZINE-DM) 6.25-15 MG/5ML syrup Take 5 mLs by mouth 4 (four) times daily as needed. 06/16/20   Becky Augusta, NP    Allergies    Azithromycin  Review of Systems   Review of Systems  All other systems reviewed and are negative.  Physical Exam Updated Vital Signs BP (!) 141/86   Pulse 60   Temp 98.1 F (36.7 C) (Oral)   Resp 18   SpO2 97%   Physical Exam Vitals and nursing note reviewed.  Constitutional:      General: She is not in acute distress.    Appearance: Normal appearance. She is well-developed. She is not toxic-appearing.  HENT:     Head: Normocephalic and atraumatic.  Eyes:     General:  Lids are normal.     Conjunctiva/sclera: Conjunctivae normal.     Pupils: Pupils are equal, round, and reactive to light.  Neck:     Thyroid: No thyroid mass.     Trachea: No tracheal deviation.  Cardiovascular:     Rate and Rhythm: Normal rate and regular rhythm.     Heart sounds: Normal heart sounds. No murmur heard.   No gallop.  Pulmonary:     Effort: Pulmonary effort is normal. No respiratory distress.     Breath sounds: Normal breath sounds. No stridor. No decreased breath sounds, wheezing, rhonchi or rales.  Abdominal:     General: There is no distension.     Palpations: Abdomen is soft.     Tenderness: There is abdominal tenderness in the epigastric area. There is no guarding or rebound.  Musculoskeletal:        General: No tenderness. Normal range of motion.     Cervical back:  Normal range of motion and neck supple.  Skin:    General: Skin is warm and dry.     Findings: No abrasion or rash.  Neurological:     Mental Status: She is alert and oriented to person, place, and time. Mental status is at baseline.     GCS: GCS eye subscore is 4. GCS verbal subscore is 5. GCS motor subscore is 6.     Cranial Nerves: Cranial nerves are intact. No cranial nerve deficit.     Sensory: No sensory deficit.     Motor: Motor function is intact.  Psychiatric:        Attention and Perception: Attention normal.        Speech: Speech normal.        Behavior: Behavior normal.    ED Results / Procedures / Treatments   Labs (all labs ordered are listed, but only abnormal results are displayed) Labs Reviewed  CBC WITH DIFFERENTIAL/PLATELET - Abnormal; Notable for the following components:      Result Value   WBC 23.6 (*)    Platelets 518 (*)    Neutro Abs 22.1 (*)    Abs Immature Granulocytes 0.18 (*)    All other components within normal limits  COMPREHENSIVE METABOLIC PANEL - Abnormal; Notable for the following components:   Potassium 3.0 (*)    CO2 21 (*)    Glucose, Bld 138 (*)    Total Bilirubin 1.7 (*)    All other components within normal limits    EKG None  Radiology CT ABDOMEN PELVIS W CONTRAST  Result Date: 04/06/2021 CLINICAL DATA:  Abdominal pain, acute, nonlocalized pain EXAM: CT ABDOMEN AND PELVIS WITH CONTRAST TECHNIQUE: Multidetector CT imaging of the abdomen and pelvis was performed using the standard protocol following bolus administration of intravenous contrast. CONTRAST:  OMNIPAQUE IOHEXOL 350 MG/ML SOLN COMPARISON:  None. FINDINGS: Lower chest: No acute abnormality. Hepatobiliary: No focal liver abnormality is seen. No gallstones, gallbladder wall thickening, or biliary dilatation. Pancreas: Unremarkable. No pancreatic ductal dilatation or surrounding inflammatory changes. Spleen: Normal in size without focal abnormality. Adrenals/Urinary Tract:  Adrenal glands are unremarkable. Kidneys are normal, without renal calculi, focal lesion, or hydronephrosis. Bladder is unremarkable. Stomach/Bowel: Stomach is unremarkable. There is no evidence of bowel obstruction. The appendix is normal. There is submucosal fat deposition within the terminal ileum, ascending colon, transverse colon, and descending colon. Sparing of the rectosigmoid colon. There is no adjacent inflammatory stranding. Vascular/Lymphatic: No significant vascular findings are present. No enlarged abdominal or pelvic lymph nodes.  Reproductive: There is a 5.3 x 5.1x 5.1 cm right ovarian cyst, previously measuring up to 5.6 cm on prior ultrasound in June. Other: No hernia.  No abdominopelvic ascites. Musculoskeletal: No acute or significant osseous findings. IMPRESSION: Fatty mural stratification within the terminal ileum, ascending, transverse, and descending colon. Findings suggest chronic sequela of nonspecific colitis/terminal ileitis, particularly Crohn's disease given this distribution, if clinically relevant signs and symptoms are present. No acute inflammatory findings. 5.3 cm right ovarian cyst, previously measuring up to 5.6 cm on prior ultrasound, right ovarian torsion can not be excluded. Electronically Signed   By: Caprice Renshaw M.D.   On: 04/06/2021 15:57   DG Abdomen Acute W/Chest  Result Date: 04/07/2021 CLINICAL DATA:  Vomiting and abdominal pain. EXAM: DG ABDOMEN ACUTE WITH 1 VIEW CHEST COMPARISON:  None. FINDINGS: There is no evidence of dilated bowel loops or free intraperitoneal air. No radiopaque calculi or other significant radiographic abnormality is seen. Heart size and mediastinal contours are within normal limits. Both lungs are clear. IMPRESSION: Negative abdominal radiographs.  No acute cardiopulmonary disease. Electronically Signed   By: Aram Candela M.D.   On: 04/07/2021 23:39    Procedures Procedures   Medications Ordered in ED Medications  lactated ringers  infusion (has no administration in time range)  lactated ringers bolus 1,000 mL (has no administration in time range)  morphine 4 MG/ML injection 6 mg (has no administration in time range)  metoCLOPramide (REGLAN) injection 5 mg (has no administration in time range)  pantoprazole (PROTONIX) injection 40 mg (has no administration in time range)  potassium chloride 10 mEq in 100 mL IVPB (has no administration in time range)  ondansetron (ZOFRAN-ODT) disintegrating tablet 4 mg (4 mg Oral Given 04/07/21 2304)  ondansetron (ZOFRAN-ODT) disintegrating tablet 4 mg (4 mg Oral Given 04/08/21 4782)    ED Course  I have reviewed the triage vital signs and the nursing notes.  Pertinent labs & imaging results that were available during my care of the patient were reviewed by me and considered in my medical decision making (see chart for details).    MDM Rules/Calculators/A&P                           Patient given IV fluids here along with antiemetics.  Still unable to keep down p.o. fluids.  In light of the fact that this is patient's third ED visit in his many days.  She will require admission and GI consultation Final Clinical Impression(s) / ED Diagnoses Final diagnoses:  None    Rx / DC Orders ED Discharge Orders     None        Lorre Nick, MD 04/08/21 1258

## 2021-04-09 ENCOUNTER — Inpatient Hospital Stay (HOSPITAL_COMMUNITY): Payer: Self-pay | Admitting: Anesthesiology

## 2021-04-09 ENCOUNTER — Encounter (HOSPITAL_COMMUNITY): Payer: Self-pay | Admitting: Internal Medicine

## 2021-04-09 ENCOUNTER — Encounter (HOSPITAL_COMMUNITY)
Admission: EM | Disposition: A | Discharge status: Home / Self Care | Payer: Self-pay | Source: Home / Self Care | Attending: Internal Medicine

## 2021-04-09 HISTORY — PX: ESOPHAGOGASTRODUODENOSCOPY (EGD) WITH PROPOFOL: SHX5813

## 2021-04-09 HISTORY — PX: BIOPSY: SHX5522

## 2021-04-09 LAB — BASIC METABOLIC PANEL
Anion gap: 9 (ref 5–15)
BUN: 9 mg/dL (ref 6–20)
CO2: 24 mmol/L (ref 22–32)
Calcium: 9 mg/dL (ref 8.9–10.3)
Chloride: 103 mmol/L (ref 98–111)
Creatinine, Ser: 0.76 mg/dL (ref 0.44–1.00)
GFR, Estimated: >60 mL/min (ref >60)
Glucose, Bld: 113 mg/dL — ABNORMAL HIGH (ref 70–99)
Potassium: 3.5 mmol/L (ref 3.5–5.1)
Sodium: 136 mmol/L (ref 135–145)

## 2021-04-09 LAB — CBC
HCT: 41.7 % (ref 36.0–46.0)
Hemoglobin: 14.1 g/dL (ref 12.0–15.0)
MCH: 29 pg (ref 26.0–34.0)
MCHC: 33.8 g/dL (ref 30.0–36.0)
MCV: 85.8 fL (ref 80.0–100.0)
Platelets: 453 10*3/uL — ABNORMAL HIGH (ref 150–400)
RBC: 4.86 MIL/uL (ref 3.87–5.11)
RDW: 13.5 % (ref 11.5–15.5)
WBC: 22.2 10*3/uL — ABNORMAL HIGH (ref 4.0–10.5)
nRBC: 0 % (ref 0.0–0.2)

## 2021-04-09 LAB — HIV ANTIBODY (ROUTINE TESTING W REFLEX): HIV Screen 4th Generation wRfx: NONREACTIVE

## 2021-04-09 SURGERY — ESOPHAGOGASTRODUODENOSCOPY (EGD) WITH PROPOFOL
Anesthesia: General

## 2021-04-09 MED ORDER — PROPOFOL 10 MG/ML IV BOLUS
INTRAVENOUS | Status: DC | PRN
  Administered 2021-04-09: 200 mg via INTRAVENOUS

## 2021-04-09 MED ORDER — LIDOCAINE 2% (20 MG/ML) 5 ML SYRINGE
INTRAMUSCULAR | Status: DC | PRN
Start: 2021-04-09 — End: 2021-04-09
  Administered 2021-04-09: 60 mg via INTRAVENOUS

## 2021-04-09 MED ORDER — LACTATED RINGERS IV SOLN: INTRAVENOUS | Status: DC | PRN

## 2021-04-09 MED ORDER — PEG-KCL-NACL-NASULF-NA ASC-C 100 G PO SOLR: 1.0000 | Freq: Two times a day (BID) | ORAL | Status: DC

## 2021-04-09 MED ORDER — MIDAZOLAM HCL 2 MG/2ML IJ SOLN
INTRAMUSCULAR | Status: DC | PRN
  Administered 2021-04-09: 2 mg via INTRAVENOUS

## 2021-04-09 MED ORDER — SUCCINYLCHOLINE CHLORIDE 200 MG/10ML IV SOSY
PREFILLED_SYRINGE | INTRAVENOUS | Status: DC | PRN
  Administered 2021-04-09: 120 mg via INTRAVENOUS

## 2021-04-09 MED ORDER — ALBUTEROL SULFATE HFA 108 (90 BASE) MCG/ACT IN AERS
INHALATION_SPRAY | RESPIRATORY_TRACT | Status: DC | PRN
  Administered 2021-04-09: 6 via RESPIRATORY_TRACT

## 2021-04-09 MED ORDER — ONDANSETRON HCL 4 MG PO TABS
8.0000 mg | ORAL_TABLET | Freq: Two times a day (BID) | ORAL | Status: DC
  Administered 2021-04-09: 8 mg via ORAL
  Filled 2021-04-09: qty 2

## 2021-04-09 MED ORDER — PANTOPRAZOLE SODIUM 40 MG IV SOLR
40.0000 mg | Freq: Two times a day (BID) | INTRAVENOUS | Status: DC
  Administered 2021-04-09 – 2021-04-10 (×3): 40 mg via INTRAVENOUS
  Filled 2021-04-09 (×3): qty 40

## 2021-04-09 MED ORDER — PEG-KCL-NACL-NASULF-NA ASC-C 100 G PO SOLR
0.5000 | Freq: Once | ORAL | Status: AC
  Administered 2021-04-09: 100 g via ORAL
  Filled 2021-04-09: qty 1

## 2021-04-09 MED ORDER — FENTANYL CITRATE (PF) 100 MCG/2ML IJ SOLN
INTRAMUSCULAR | Status: AC
  Filled 2021-04-09: qty 2

## 2021-04-09 MED ORDER — PEG-KCL-NACL-NASULF-NA ASC-C 100 G PO SOLR
0.5000 | Freq: Once | ORAL | Status: DC
Start: 2021-04-10 — End: 2021-04-12

## 2021-04-09 MED ORDER — MIDAZOLAM HCL 2 MG/2ML IJ SOLN
INTRAMUSCULAR | Status: AC
  Filled 2021-04-09: qty 2

## 2021-04-09 MED ORDER — DEXAMETHASONE SODIUM PHOSPHATE 10 MG/ML IJ SOLN
INTRAMUSCULAR | Status: DC | PRN
  Administered 2021-04-09: 10 mg via INTRAVENOUS

## 2021-04-09 MED ORDER — FENTANYL CITRATE (PF) 250 MCG/5ML IJ SOLN
INTRAMUSCULAR | Status: DC | PRN
  Administered 2021-04-09 (×2): 50 ug via INTRAVENOUS

## 2021-04-09 MED ORDER — ONDANSETRON HCL 4 MG/2ML IJ SOLN
INTRAMUSCULAR | Status: DC | PRN
  Administered 2021-04-09: 4 mg via INTRAVENOUS

## 2021-04-09 SURGICAL SUPPLY — 15 items

## 2021-04-09 NOTE — Interval H&P Note (Signed)
History and Physical Interval Note:  04/09/2021 8:22 AM  Diana Randolph  has presented today for surgery, with the diagnosis of Nausea/vomiting and hematemesis.  The various methods of treatment have been discussed with the patient and family. After consideration of risks, benefits and other options for treatment, the patient has consented to  Procedure(s): ESOPHAGOGASTRODUODENOSCOPY (EGD) WITH PROPOFOL (N/A) as a surgical intervention.  The patient's history has been reviewed, patient examined, no change in status, stable for surgery.  I have reviewed the patient's chart and labs.  Questions were answered to the patient's satisfaction.     Rachael Fee

## 2021-04-09 NOTE — Anesthesia Preprocedure Evaluation (Addendum)
Anesthesia Evaluation  Patient identified by MRN, date of birth, ID band Patient awake    Reviewed: Allergy & Precautions, NPO status , Patient's Chart, lab work & pertinent test results  History of Anesthesia Complications Negative for: history of anesthetic complications  Airway Mallampati: II  TM Distance: >3 FB Neck ROM: Full    Dental  (+) Dental Advisory Given, Teeth Intact   Pulmonary former smoker,    Pulmonary exam normal        Cardiovascular negative cardio ROS Normal cardiovascular exam     Neuro/Psych negative neurological ROS  negative psych ROS   GI/Hepatic negative GI ROS, (+)     substance abuse  marijuana use,   Endo/Other   Obesity   Renal/GU negative Renal ROS     Musculoskeletal negative musculoskeletal ROS (+)   Abdominal   Peds  Hematology negative hematology ROS (+)   Anesthesia Other Findings   Reproductive/Obstetrics                            Anesthesia Physical Anesthesia Plan  ASA: 2  Anesthesia Plan: General   Post-op Pain Management:    Induction: Intravenous and Rapid sequence  PONV Risk Score and Plan: 2 and Treatment may vary due to age or medical condition and Propofol infusion  Airway Management Planned: Oral ETT  Additional Equipment: None  Intra-op Plan:   Post-operative Plan: Extubation in OR  Informed Consent: I have reviewed the patients History and Physical, chart, labs and discussed the procedure including the risks, benefits and alternatives for the proposed anesthesia with the patient or authorized representative who has indicated his/her understanding and acceptance.     Dental advisory given  Plan Discussed with: Anesthesiologist and CRNA  Anesthesia Plan Comments:        Anesthesia Quick Evaluation

## 2021-04-09 NOTE — Transfer of Care (Signed)
Immediate Anesthesia Transfer of Care Note  Patient: Diana Randolph  Procedure(s) Performed: ESOPHAGOGASTRODUODENOSCOPY (EGD) WITH PROPOFOL BIOPSY  Patient Location: Endoscopy Unit  Anesthesia Type:General  Level of Consciousness: drowsy  Airway & Oxygen Therapy: Patient Spontanous Breathing and Patient connected to face mask oxygen  Post-op Assessment: Report given to RN and Post -op Vital signs reviewed and stable  Post vital signs: Reviewed and stable  Last Vitals:  Vitals Value Taken Time  BP 161/91 04/09/21 0948  Temp    Pulse 123 04/09/21 0950  Resp 29 04/09/21 0950  SpO2 100 % 04/09/21 0950  Vitals shown include unvalidated device data.  Last Pain:  Vitals:   04/09/21 0819  TempSrc: Temporal  PainSc: 0-No pain         Complications: No notable events documented.

## 2021-04-09 NOTE — Progress Notes (Signed)
Unable to tolerate Movi prep. N/V and dry heaving.

## 2021-04-09 NOTE — Anesthesia Postprocedure Evaluation (Signed)
Anesthesia Post Note  Patient: Diana Randolph  Procedure(s) Performed: ESOPHAGOGASTRODUODENOSCOPY (EGD) WITH PROPOFOL BIOPSY     Patient location during evaluation: PACU Anesthesia Type: General Level of consciousness: awake and alert Pain management: pain level controlled Vital Signs Assessment: post-procedure vital signs reviewed and stable Respiratory status: spontaneous breathing, nonlabored ventilation and respiratory function stable Cardiovascular status: stable and blood pressure returned to baseline Anesthetic complications: no   No notable events documented.  Last Vitals:  Vitals:   04/09/21 1009 04/09/21 1029  BP: (!) 142/80 118/81  Pulse: (!) 59 60  Resp: (!) 23 16  Temp:  37.4 C  SpO2: 99% 100%    Last Pain:  Vitals:   04/09/21 1009  TempSrc:   PainSc: 0-No pain                 Beryle Lathe

## 2021-04-09 NOTE — Progress Notes (Signed)
PROGRESS NOTE  Diana Randolph  DOB: 09/09/1990  PCP: Luciana Axe, NP FXT:[id number]  DOA: 04/07/2021  LOS: 1 day  Hospital Day: 3  Chief complaint: Intractable nausea, vomiting.  Brief narrative: Diana Randolph is a 30 y.o. female with PMH significant for obesity, anxiety, depression who presented to ED with complaint of abdominal pain, intractable nausea and vomiting for last 2 days which later turned her to have dark-colored emesis.  In the ED, patient was afebrile and hemodynamically stable. CT abdomen pelvis was obtained which showed possibility of Crohn's disease.   Urine drug screen was positive for THC and opiates. Admitted to hospitalist service GI consultation was obtained.  Subjective: Patient was seen and examined this morning. [place] Caucasian female.  Not in distress She underwent EGD this morning.  Findings as below. Was drowsy from the procedure.  Assessment/Plan: Intractable nausea and vomiting -Admitted for the same.  Seen by GI.  Underwent EGD today 8/29. -Found to have severe GERD/vomiting related ulcerative esophagitis. -Continue PPI twice daily, IV Zofran.  Abnormal CT scan finding -CT abdomen raised suspicion of Crohn's disease. -Noted plan for colonoscopy tomorrow.   -Not on scheduled steroids at this time.   Leukocytosis -WC count significantly elevated to 24,000 probably reactive.  Continue to monitor off antibiotics.   -Repeat lactic acid level tomorrow. Recent Labs  Lab 04/06/21 1225 04/06/21 1308 04/07/21 1225 04/08/21 0042 04/09/21 0351  WBC 16.5*  --  26.8* 23.6* 22.2*  LATICACIDVEN  --  4.5*  --   --   --     Anxiety/Depression -Continue SSRI   Morbid obesity  -Body mass index is 35.67 kg/m. Patient has been advised to make an attempt to improve diet and exercise patterns to aid in weight loss.  Marijuana use -Reports everyday marijuana smoking.  Unclear if patient has cannabis induced hyperemesis syndrome -Counseled to  quit.  Mobility: Encourage ambulation Code Status:   Code Status: Full Code  Nutritional status: Body mass index is 35.67 kg/m.     Diet: Clear liquid diet for now. Diet Order             Diet NPO time specified Except for: Sips with Meds  Diet effective 0500 tomorrow           Diet clear liquid Room service appropriate? Yes; Fluid consistency: Thin  Diet effective now                  DVT prophylaxis:  SCDs Start: 04/08/21 1344   Antimicrobials: None Fluid: Currently on LR at 125 mill per hour. Consultants: GI Family Communication: None at bedside  Status is: Inpatient  Remains inpatient appropriate because: Pending colonoscopy tomorrow  Dispo: The patient is from: Home              Anticipated d/c is to: Home in 1 to 2 days              Patient currently is not medically stable to d/c.   Difficult to place patient No     Infusions:   lactated ringers 125 mL/hr at 04/08/21 1607   promethazine (PHENERGAN) injection (IM or IVPB) 12.5 mg (04/09/21 0452)    Scheduled Meds:  DULoxetine  30 mg Oral Daily   norethindrone  1 tablet Oral Daily   nortriptyline  10 mg Oral QHS   ondansetron  8 mg Oral Q12H   pantoprazole (PROTONIX) IV  40 mg Intravenous Q12H   peg 3350 powder  0.5 kit  Oral Once   And   [START ON 04/10/2021] peg 3350 powder  0.5 kit Oral Once    Antimicrobials: Anti-infectives (From admission, onward)    None       PRN meds: acetaminophen **OR** acetaminophen, ondansetron **OR** ondansetron (ZOFRAN) IV, phenol, promethazine (PHENERGAN) injection (IM or IVPB)   Objective: Vitals:   04/09/21 1009 04/09/21 1029  BP: (!) 142/80 118/81  Pulse: (!) 59 60  Resp: (!) 23 16  Temp:  99.4 F (37.4 C)  SpO2: 99% 100%    Intake/Output Summary (Last 24 hours) at 04/09/2021 1512 Last data filed at 04/09/2021 0700 Gross per 24 hour  Intake 1879.21 ml  Output 0 ml  Net 1879.21 ml   Filed Weights   04/08/21 1800 04/08/21 2118  Weight: 106.4  kg 106.4 kg   Weight change:  Body mass index is 35.67 kg/m.   Physical Exam: General exam: Pleasant, young Caucasian female.  Not in distress Skin: No rashes, lesions or ulcers. HEENT: Atraumatic, normocephalic, no obvious bleeding Lungs: Clear to auscultation bilaterally CVS: Regular rate and rhythm, no murmur GI/Abd soft, nontender, nondistended, bowel sound present CNS: Under the effect of anesthesia.  Waking up, able to follow commands.   Psychiatry: Mood appropriate Extremities: No pedal edema, no calf tenderness  Data Review: I have personally reviewed the laboratory data and studies available.  Recent Labs  Lab 04/06/21 1225 04/07/21 1225 04/08/21 0042 04/09/21 0351  WBC 16.5* 26.8* 23.6* 22.2*  NEUTROABS 14.4* 23.1* 22.1*  --   HGB 15.8* 14.5 14.5 14.1  HCT 47.0* 43.0 44.3 41.7  MCV 85.8 85.3 86.7 85.8  PLT 513* 518* 518* 453*   Recent Labs  Lab 04/06/21 1225 04/07/21 1225 04/08/21 0042 04/09/21 0351  NA 136 136 136 136  K 3.8 3.5 3.0* 3.5  CL 105 104 103 103  CO2 19* 21* 21* 24  GLUCOSE 173* 121* 138* 113*  BUN _0 CREATININE 0.81 0.72 0.78 0.76  CALCIUM 9.4 9.4 9.4 9.0    F/u labs ordered Unresulted Labs (From admission, onward)     Start     Ordered   04/10/21 2575  Basic metabolic panel  Daily,   R      04/09/21 1508   04/10/21 0500  CBC with Differential/Platelet  Daily,   R      04/09/21 1508   04/10/21 0500  Magnesium  Tomorrow morning,   STAT        04/09/21 1508   04/10/21 0500  Phosphorus  Tomorrow morning,   R        04/09/21 1508   04/10/21 0500  Lactic acid, plasma  Tomorrow morning,   R        04/09/21 1508            Signed, Terrilee Croak, MD [facility] 04/09/2021

## 2021-04-09 NOTE — Anesthesia Procedure Notes (Signed)
Procedure Name: Intubation Date/Time: 04/09/2021 9:22 AM Performed by: Carolan Clines, CRNA Pre-anesthesia Checklist: Patient identified, Emergency Drugs available, Suction available and Patient being monitored Patient Re-evaluated:Patient Re-evaluated prior to induction Oxygen Delivery Method: Circle System Utilized Preoxygenation: Pre-oxygenation with 100% oxygen Induction Type: IV induction, Rapid sequence and Cricoid Pressure applied Ventilation: Mask ventilation without difficulty Laryngoscope Size: Mac and 3 Grade View: Grade I Tube type: Oral Tube size: 7.0 mm Number of attempts: 1 Airway Equipment and Method: Stylet Placement Confirmation: ETT inserted through vocal cords under direct vision, positive ETCO2 and breath sounds checked- equal and bilateral Secured at: 22 cm Tube secured with: Tape Dental Injury: Teeth and Oropharynx as per pre-operative assessment

## 2021-04-09 NOTE — Op Note (Signed)
[facility] Patient Name: Diana Randolph Procedure Date : 04/09/2021 MRN: [id number] Attending MD: Rachael Fee , MD Date of Birth: 05-Aug-1991 CSN: [id number] Age: 30 Admit Type: Inpatient Procedure:                Upper GI endoscopy Indications:              Nausea with vomiting Providers:                Rachael Fee, MD, Glory Rosebush, RN, Michele Mcalpine Technician Referring MD:              Medicines:                Monitored Anesthesia Care Complications:            No immediate complications. Estimated blood loss:                            None. Estimated Blood Loss:     Estimated blood loss: none. Procedure:                Pre-Anesthesia Assessment:                           - Prior to the procedure, a History and Physical                            was performed, and patient medications and                            allergies were reviewed. The patient's tolerance of                            previous anesthesia was also reviewed. The risks                            and benefits of the procedure and the sedation                            options and risks were discussed with the patient.                            All questions were answered, and informed consent                            was obtained. Prior Anticoagulants: The patient has                            taken no previous anticoagulant or antiplatelet                            agents. ASA Grade Assessment: II - A patient with  mild systemic disease. After reviewing the risks                            and benefits, the patient was deemed in                            satisfactory condition to undergo the procedure.                           After obtaining informed consent, the endoscope was                            passed under direct vision. Throughout the                            procedure, the patient's blood pressure, pulse, and                             oxygen saturations were monitored continuously. The                            [id number]-H190 ([id number]) Olympus endoscope was introduced                            through the mouth, and advanced to the second part                            of duodenum. The upper GI endoscopy was                            accomplished without difficulty. The patient                            tolerated the procedure well. Scope In: Scope Out: Findings:      LA Grade D GERD related ulcerative esophagitis from GE junction to the       proximal esophagus.      Mild inflammation characterized by erythema and granularity was found in       the gastric antrum. Biopsies were taken with a cold forceps for       histology.      There was some focal edema in the proximal stomach that is probably due       to her persistent vomiting, retching. No hiatal hernia.      The exam was otherwise without abnormality. Impression:               - Severe GERD (vomiting) related ulcerative                            esophagitis.                           - The vomiting is also causing some localized                            trauma, edema to her  proximal stomach.                           - Mild, non-specific distal gastritis. Biopsied to                            check for H. pylori.                           - The examination was otherwise normal. Recommendation:           - Return patient to hospital ward for ongoing care.                            These findings probably do not account for her                            acute illness. I am changing her PPI to twice daily                            dosing for now and plan to get a better idea of her                            THC use to see if this all may be from cannabis                            hyperemesis (tox screen was +).                           - I am planning a colonsocopy for her tomororw. She                            has a very  abnormal colon by CT and may have                            Crohn's desease. Procedure Code(s):        --- Professional ---                           [phone], Esophagogastroduodenoscopy, flexible,                            transoral; with biopsy, single or multiple Diagnosis Code(s):        --- Professional ---                           K29.70, Gastritis, unspecified, without bleeding                           R11.2, Nausea with vomiting, unspecified CPT copyright 2019 [employer]. All rights reserved. The codes documented in this report are preliminary and upon coder review may  be revised to meet current compliance requirements. Rachael Fee, MD 04/09/2021 9:51:11 AM This report has been signed electronically. Number of Addenda: 0

## 2021-04-10 ENCOUNTER — Encounter (HOSPITAL_COMMUNITY): Payer: Self-pay | Admitting: Certified Registered Nurse Anesthetist

## 2021-04-10 ENCOUNTER — Encounter (HOSPITAL_COMMUNITY): Payer: Self-pay | Admitting: Internal Medicine

## 2021-04-10 DIAGNOSIS — R112 Nausea with vomiting, unspecified: Secondary | ICD-10-CM

## 2021-04-10 LAB — CBC WITH DIFFERENTIAL/PLATELET
Abs Immature Granulocytes: 0.14 10*3/uL — ABNORMAL HIGH (ref 0.00–0.07)
Basophils Absolute: 0 10*3/uL (ref 0.0–0.1)
Basophils Relative: 0 %
Eosinophils Absolute: 0 10*3/uL (ref 0.0–0.5)
Eosinophils Relative: 0 %
HCT: 39.5 % (ref 36.0–46.0)
Hemoglobin: 13.3 g/dL (ref 12.0–15.0)
Immature Granulocytes: 1 %
Lymphocytes Relative: 14 %
Lymphs Abs: 2.2 10*3/uL (ref 0.7–4.0)
MCH: 28.4 pg (ref 26.0–34.0)
MCHC: 33.7 g/dL (ref 30.0–36.0)
MCV: 84.4 fL (ref 80.0–100.0)
Monocytes Absolute: 1.5 10*3/uL — ABNORMAL HIGH (ref 0.1–1.0)
Monocytes Relative: 10 %
Neutro Abs: 11.8 10*3/uL — ABNORMAL HIGH (ref 1.7–7.7)
Neutrophils Relative %: 75 %
Platelets: 410 10*3/uL — ABNORMAL HIGH (ref 150–400)
RBC: 4.68 MIL/uL (ref 3.87–5.11)
RDW: 13.3 % (ref 11.5–15.5)
WBC: 15.7 10*3/uL — ABNORMAL HIGH (ref 4.0–10.5)
nRBC: 0 % (ref 0.0–0.2)

## 2021-04-10 LAB — PHOSPHORUS: Phosphorus: 2.6 mg/dL (ref 2.5–4.6)

## 2021-04-10 LAB — BASIC METABOLIC PANEL
Anion gap: 12 (ref 5–15)
BUN: 9 mg/dL (ref 6–20)
CO2: 23 mmol/L (ref 22–32)
Calcium: 8.6 mg/dL — ABNORMAL LOW (ref 8.9–10.3)
Chloride: 99 mmol/L (ref 98–111)
Creatinine, Ser: 0.75 mg/dL (ref 0.44–1.00)
GFR, Estimated: >60 mL/min (ref >60)
Glucose, Bld: 105 mg/dL — ABNORMAL HIGH (ref 70–99)
Potassium: 3.1 mmol/L — ABNORMAL LOW (ref 3.5–5.1)
Sodium: 134 mmol/L — ABNORMAL LOW (ref 135–145)

## 2021-04-10 LAB — MAGNESIUM: Magnesium: 2 mg/dL (ref 1.7–2.4)

## 2021-04-10 LAB — LACTIC ACID, PLASMA: Lactic Acid, Venous: 1 mmol/L (ref 0.5–1.9)

## 2021-04-10 SURGERY — COLONOSCOPY WITH PROPOFOL
Anesthesia: Monitor Anesthesia Care

## 2021-04-10 MED ORDER — PANTOPRAZOLE SODIUM 40 MG PO TBEC
40.0000 mg | DELAYED_RELEASE_TABLET | Freq: Two times a day (BID) | ORAL | Status: DC
  Administered 2021-04-10 – 2021-04-12 (×4): 40 mg via ORAL
  Filled 2021-04-10 (×4): qty 1

## 2021-04-10 MED ORDER — POTASSIUM CHLORIDE CRYS ER 20 MEQ PO TBCR
40.0000 meq | EXTENDED_RELEASE_TABLET | Freq: Once | ORAL | Status: DC
  Filled 2021-04-10: qty 2

## 2021-04-10 MED ORDER — SODIUM CHLORIDE 0.9 % IV SOLN: INTRAVENOUS | Status: DC

## 2021-04-10 MED ORDER — ONDANSETRON HCL 4 MG/2ML IJ SOLN
4.0000 mg | Freq: Four times a day (QID) | INTRAMUSCULAR | Status: AC
  Administered 2021-04-10 – 2021-04-11 (×6): 4 mg via INTRAVENOUS
  Filled 2021-04-10 (×6): qty 2

## 2021-04-10 NOTE — Progress Notes (Signed)
PROGRESS NOTE  Diana Randolph  DOB: 01/31/1991  PCP: Luciana Axe, NP TGG:[id number]  DOA: 04/07/2021  LOS: 2 days  Hospital Day: 4  Chief complaint: Intractable nausea, vomiting.  Brief narrative: Diana Randolph is a 30 y.o. female with PMH significant for obesity, anxiety, depression who presented to ED with complaint of abdominal pain, intractable nausea and vomiting for last 2 days which later turned her to have dark-colored emesis.  In the ED, patient was afebrile and hemodynamically stable. CT abdomen pelvis was obtained which showed possibility of Crohn's disease.   Urine drug screen was positive for THC and opiates. Admitted to hospitalist service GI consultation was obtained.  Subjective: Patient was seen and examined this morning. Lying on bed.  Not in distress.  Still nauseated and vomiting.  Unable to tolerate bowel prep and hence could not go for colonoscopy today.  Assessment/Plan: Intractable nausea and vomiting -Admitted for the same.  Seen by GI.  Underwent EGD on 8/29. -Found to have severe GERD/vomiting related ulcerative esophagitis. -She has plan for colonoscopy today but could not tolerate prep because of nausea. -Continue PPI twice daily, IV Zofran and IV Reglan as needed. -Wonder if persistent vomiting is related to use of marijuana and vaping.  Counseled to quit.  Abnormal CT scan finding -CT abdomen raised suspicion of Crohn's disease. -Colonoscopy as an outpatient -Not on scheduled steroids at this time.   Leukocytosis -WBC count was significantly elevated to 24,000 probably reactive.  Continue to monitor off antibiotics.  WBC count improving. -Repeat lactic acid level this morning is normal.. Recent Labs  Lab 04/06/21 1225 04/06/21 1308 04/07/21 1225 04/08/21 0042 04/09/21 0351 04/10/21 0419  WBC 16.5*  --  26.8* 23.6* 22.2* 15.7*  LATICACIDVEN  --  4.5*  --   --   --  1.0    Anxiety/Depression -Continue SSRI   Morbid obesity   -Body mass index is 35.67 kg/m. Patient has been advised to make an attempt to improve diet and exercise patterns to aid in weight loss.  Marijuana use -Reports everyday marijuana smoking.  Unclear if patient has cannabis induced hyperemesis syndrome -Counseled to quit.  Mobility: Encourage ambulation Code Status:   Code Status: Full Code  Nutritional status: Body mass index is 35.67 kg/m.     Diet: Clear liquid diet for now. Diet Order             Diet clear liquid Room service appropriate? Yes; Fluid consistency: Thin  Diet effective now                  DVT prophylaxis:  SCDs Start: 04/08/21 1344   Antimicrobials: None Fluid: Currently on LR.  Reduced rate to 50 mill per hour. Consultants: GI Family Communication: None at bedside  Status is: Inpatient  Remains inpatient appropriate because: Pending colonoscopy tomorrow  Dispo: The patient is from: Home              Anticipated d/c is to: Home in 1 to 2 days              Patient currently is not medically stable to d/c.   Difficult to place patient No     Infusions:   lactated ringers 125 mL/hr at 04/10/21 0502   promethazine (PHENERGAN) injection (IM or IVPB) 12.5 mg (04/09/21 2241)    Scheduled Meds:  DULoxetine  30 mg Oral Daily   norethindrone  1 tablet Oral Daily   nortriptyline  10 mg Oral QHS  ondansetron  4 mg Intravenous Q6H   pantoprazole  40 mg Oral BID AC   peg 3350 powder  0.5 kit Oral Once   potassium chloride  40 mEq Oral Once    Antimicrobials: Anti-infectives (From admission, onward)    None       PRN meds: acetaminophen **OR** acetaminophen, phenol, promethazine (PHENERGAN) injection (IM or IVPB)   Objective: Vitals:   04/09/21 2010 04/10/21 0515  BP: 128/84 111/72  Pulse: (!) 57 (!) 56  Resp: 18 16  Temp: 98.6 F (37 C) 98.5 F (36.9 C)  SpO2: 100% 100%    Intake/Output Summary (Last 24 hours) at 04/10/2021 1605 Last data filed at 04/10/2021 1300 Gross per 24  hour  Intake 2694.76 ml  Output 500 ml  Net 2194.76 ml   Filed Weights   04/08/21 1800 04/08/21 2118  Weight: 106.4 kg 106.4 kg   Weight change:  Body mass index is 35.67 kg/m.   Physical Exam: General exam: Pleasant, young Caucasian female.  Not in distress Skin: No rashes, lesions or ulcers. HEENT: Atraumatic, normocephalic, no obvious bleeding Lungs: Clear to auscultation bilaterally CVS: Regular rate and rhythm, no murmur GI/Abd soft, nontender, nondistended, bowel sound present CNS: Alert, awake, oriented x3 Psychiatry: Mood appropriate Extremities: No pedal edema, no calf tenderness  Data Review: I have personally reviewed the laboratory data and studies available.  Recent Labs  Lab 04/06/21 1225 04/07/21 1225 04/08/21 0042 04/09/21 0351 04/10/21 0419  WBC 16.5* 26.8* 23.6* 22.2* 15.7*  NEUTROABS 14.4* 23.1* 22.1*  --  11.8*  HGB 15.8* 14.5 14.5 14.1 13.3  HCT 47.0* 43.0 44.3 41.7 39.5  MCV 85.8 85.3 86.7 85.8 84.4  PLT 513* 518* 518* 453* 410*   Recent Labs  Lab 04/06/21 1225 04/07/21 1225 04/08/21 0042 04/09/21 0351 04/10/21 0419  NA 136 136 136 136 134*  K 3.8 3.5 3.0* 3.5 3.1*  CL 105 104 103 103 99  CO2 19* 21* 21* 24 23  GLUCOSE 173* 121* 138* 113* 105*  BUN 10 8 10 9 9   CREATININE 0.81 0.72 0.78 0.76 0.75  CALCIUM 9.4 9.4 9.4 9.0 8.6*  MG  --   --   --   --  2.0  PHOS  --   --   --   --  2.6    F/u labs ordered Unresulted Labs (From admission, onward)    None       Signed, Terrilee Croak, MD [facility] 04/10/2021

## 2021-04-10 NOTE — Plan of Care (Signed)
  Problem: Clinical Measurements: Goal: Diagnostic test results will improve Outcome: Progressing   

## 2021-04-10 NOTE — Plan of Care (Signed)
  Problem: Clinical Measurements: Goal: Diagnostic test results will improve Outcome: Completed/Met Goal: Respiratory complications will improve Outcome: Completed/Met Goal: Cardiovascular complication will be avoided Outcome: Completed/Met   Problem: Elimination: Goal: Will not experience complications related to bowel motility Outcome: Completed/Met Goal: Will not experience complications related to urinary retention Outcome: Completed/Met   Problem: Pain Managment: Goal: General experience of comfort will improve Outcome: Completed/Met   Problem: Safety: Goal: Ability to remain free from injury will improve Outcome: Completed/Met   Problem: Skin Integrity: Goal: Risk for impaired skin integrity will decrease Outcome: Completed/Met

## 2021-04-10 NOTE — Progress Notes (Addendum)
Daily Rounding Note  04/10/2021, 9:18 AM  LOS: 2 days   SUBJECTIVE:   Chief complaint: Nausea, vomiting.  Patient providing additional history of long-term cocaine/vaping marijuanseveral times a day.  He is bouts with milder nausea and vomiting but on Friday she had severe nausea vomiting refractory to Prilosec, Pepcid. Continues to have nausea even with sips of some of her clear diet.  Last bowel movement was 3 to 4 days ago.  Some epigastric pain/discomfort.  Still feels lousy.  Unable to complete bowel prep so colonoscopy canceled.   OBJECTIVE:         Vital signs in last 24 hours:    Temp:  [97.6 F (36.4 C)-99.4 F (37.4 C)] 98.5 F (36.9 C) (08/30 0515) Pulse Rate:  [56-128] 56 (08/30 0515) Resp:  [16-27] 16 (08/30 0515) BP: (111-161)/(72-91) 111/72 (08/30 0515) SpO2:  [97 %-100 %] 100 % (08/30 0515) Last BM Date: 04/06/21 Filed Weights   04/08/21 1800 04/08/21 2118  Weight: 106.4 kg 106.4 kg   General: Looks tired, mildly ill.  Overweight. Heart: RRR. Chest: Clear bilaterally.  No labored breathing or cough Abdomen: Soft.  Obese.  Active bowel sounds.  Nontender. Extremities: No CCE. Neuro/Psych: Alert.  Oriented x3.   Lab Results: Recent Labs    04/08/21 0042 04/09/21 0351 04/10/21 0419  WBC 23.6* 22.2* 15.7*  HGB 14.5 14.1 13.3  HCT 44.3 41.7 39.5  PLT 518* 453* 410*   BMET Recent Labs    04/08/21 0042 04/09/21 0351 04/10/21 0419  NA 136 136 134*  K 3.0* 3.5 3.1*  CL 103 103 99  CO2 21* 24 23  GLUCOSE 138* 113* 105*  BUN 10 9 9   CREATININE 0.78 0.76 0.75  CALCIUM 9.4 9.0 8.6*   LFT Recent Labs    04/07/21 1225 04/08/21 0042  PROT 7.4 7.5  ALBUMIN 4.1 4.1  AST 21 18  ALT 15 16  ALKPHOS 54 57  BILITOT 1.5* 1.7*   PT/INR No results for input(s): LABPROT, INR in the last 72 hours. Hepatitis Panel No results for input(s): HEPBSAG, HCVAB, HEPAIGM, HEPBIGM in the last 72  hours.  Studies/Results: No results found.  Scheduled Meds:  DULoxetine  30 mg Oral Daily   norethindrone  1 tablet Oral Daily   nortriptyline  10 mg Oral QHS   ondansetron  8 mg Oral Q12H   pantoprazole (PROTONIX) IV  40 mg Intravenous Q12H   peg 3350 powder  0.5 kit Oral Once   potassium chloride  40 mEq Oral Once   Continuous Infusions:  lactated ringers 125 mL/hr at 04/10/21 0502   promethazine (PHENERGAN) injection (IM or IVPB) 12.5 mg (04/09/21 2241)   PRN Meds:.acetaminophen **OR** acetaminophen, ondansetron **OR** ondansetron (ZOFRAN) IV, phenol, promethazine (PHENERGAN) injection (IM or IVPB)   ASSESMENT:   Nausea, vomiting, hematemesis, epigastric pain.  No PPI etc. PTA.  04/09/2021 EGD showing severe grade D ulcerative esophagitis, mild gastric antral inflammation.  Focal edema at proximal stomach likely due to persistent vomiting.  No hiatal hernia.  Biopsies pending.  Suspect cannabis hyperemesis.  Fat[age]-year-old stratification at TI, ascending, transverse and descending colon's S/O nonspecific colitis/terminal ileitis, particularly Crohn's disease.  Unable to complete bowel prep.  Hypokalemia.     Leukocytosis, improved.   PLAN   Changing Zofran to IV every 6 hours scheduled.  Continue Protonix 40 IV every 12.?    Clear diet as tolerated.    Await gastric bx path.  Hold off on colonoscopy.  Will probably plan to do this as an outpatient.   Azucena Freed  04/10/2021, 9:18 AM Phone [phone]    ________________________________________________________________________  Velora Heckler GI MD note:  I personally examined the patient, reviewed the data and agree with the assessment and plan described above.  I am pretty certain that she has cannabis hyperemesis. She has prolonged daily exposure (smoking and vaping), has noticed significant improvement with hot showers and has an essentially negative workup otherwise.  She knows she should 100% avoid THC for at  least several months.    She tolerated jello and liquids today, hopefully this is a good sign. OK to use zofran PRN or on scheduled basis for now. IF she continues to tolerate PO advance then she is safe for d/c (with zofran script) and in that case I would want to see her in my office in 6-8 weeks to see how she is doing and also to schedule a colonoscopy given her abdnormal colon on CT (looks like Crohn's disease but she has no real Crohn's symptoms).  Will follow along.  I am going to change to PO BID PPI to help her ulcertive esophagitis heal.  No need for other antiemetics just yet. I am allowing advance as tolerated diet.   Owens Loffler, MD [facility] Pager [phone]

## 2021-04-11 ENCOUNTER — Encounter (HOSPITAL_COMMUNITY)
Admission: EM | Disposition: A | Discharge status: Home / Self Care | Payer: Self-pay | Source: Home / Self Care | Attending: Internal Medicine

## 2021-04-11 SURGERY — CANCELLED PROCEDURE

## 2021-04-11 MED ORDER — CAPSAICIN 0.075 % EX CREA
TOPICAL_CREAM | Freq: Three times a day (TID) | CUTANEOUS | Status: DC
  Filled 2021-04-11 (×2): qty 60

## 2021-04-11 MED ORDER — METOCLOPRAMIDE HCL 5 MG PO TABS
5.0000 mg | ORAL_TABLET | Freq: Three times a day (TID) | ORAL | Status: DC
  Administered 2021-04-11 – 2021-04-12 (×4): 5 mg via ORAL
  Filled 2021-04-11 (×4): qty 1

## 2021-04-11 MED ORDER — CAPSAICIN 0.025 % EX CREA
TOPICAL_CREAM | Freq: Three times a day (TID) | CUTANEOUS | Status: DC
  Filled 2021-04-11: qty 60

## 2021-04-11 MED ORDER — POTASSIUM CHLORIDE 20 MEQ PO PACK
40.0000 meq | PACK | Freq: Once | ORAL | Status: AC
  Administered 2021-04-11: 40 meq via ORAL
  Filled 2021-04-11: qty 2

## 2021-04-11 MED ORDER — POTASSIUM CHLORIDE CRYS ER 20 MEQ PO TBCR
40.0000 meq | EXTENDED_RELEASE_TABLET | Freq: Once | ORAL | Status: DC
  Filled 2021-04-11: qty 2

## 2021-04-11 MED ORDER — SUCRALFATE 1 GM/10ML PO SUSP
1.0000 g | Freq: Three times a day (TID) | ORAL | Status: DC
  Administered 2021-04-11 – 2021-04-12 (×5): 1 g via ORAL
  Filled 2021-04-11 (×5): qty 10

## 2021-04-11 MED ORDER — LIDOCAINE VISCOUS HCL 2 % MT SOLN
15.0000 mL | OROMUCOSAL | Status: DC
Start: 2021-04-11 — End: 2021-04-12
  Administered 2021-04-11 – 2021-04-12 (×6): 15 mL via OROMUCOSAL
  Filled 2021-04-11 (×10): qty 15

## 2021-04-11 MED ORDER — LIDOCAINE VISCOUS HCL 2 % MT SOLN: 15.0000 mL | OROMUCOSAL | Status: DC | PRN

## 2021-04-11 MED ORDER — POTASSIUM CHLORIDE 10 MEQ/100ML IV SOLN
10.0000 meq | INTRAVENOUS | Status: AC
  Administered 2021-04-11 (×3): 10 meq via INTRAVENOUS
  Filled 2021-04-11 (×3): qty 100

## 2021-04-11 SURGICAL SUPPLY — 21 items

## 2021-04-11 NOTE — Progress Notes (Signed)
Pt has red rash on the abdomen and under bilateral breasts started right after using Capsaicin ointment.  Cleaned the area with wet wash cloths and removed the ointment.  Oncoming nurse made aware.

## 2021-04-11 NOTE — Progress Notes (Signed)
PROGRESS NOTE  Diana Randolph  DOB: [birth year]  PCP: Loura Pardon, NP J[place]:[id number]  DOA: 04/07/2021  LOS: 3 days  Hospital Day: 5  Chief complaint: Intractable nausea, vomiting.  Brief narrative: Diana Randolph is a 30 y.o. female with PMH significant for obesity, anxiety, depression who presented to ED with complaint of abdominal pain, intractable nausea and vomiting for last 2 days which later turned her to have dark-colored emesis.  In the ED, patient was afebrile and hemodynamically stable. CT abdomen pelvis was obtained which showed possibility of Crohn's disease.   Urine drug screen was positive for THC and opiates. Admitted to hospitalist service GI consultation was obtained.  Subjective: Patient was seen and examined this morning. Still having intermittent nausea and vomiting.  Minimal appetite.  Assessment/Plan: Intractable nausea and vomiting -Admitted for the same.  EGD 8/29 showed severe GERD/vomiting related ulcerative esophagitis.  -Patient continues to have nausea and vomiting and poor appetite.  Reglan and Carafate added today.  I would also add viscous Lidocaine for next 24 hrs. Continue to monitor. -Continue PPI twice daily, IV Zofran.  Abnormal CT scan finding -CT abdomen raised suspicion of Crohn's disease. -Patient was planned for colonoscopy inpatient but because of her inability to tolerate colonoscopy prep, the plan was postponed for outpatient. -Not on scheduled steroids at this time.   Leukocytosis -WBC count is currently improving off antibiotics.  Lactic acid level improved as well.  Repeat white count tomorrow. Recent Labs  Lab 04/06/21 1225 04/06/21 1308 04/07/21 1225 04/08/21 0042 04/09/21 0351 04/10/21 0419  WBC 16.5*  --  26.8* 23.6* 22.2* 15.7*  LATICACIDVEN  --  4.5*  --   --   --  1.0   Hypokalemia -Level low at 3.1.  Unable to tolerate oral replacement.  Ordered for IV replacement. Recent Labs  Lab 04/06/21 1225  04/07/21 1225 04/08/21 0042 04/09/21 0351 04/10/21 0419  K 3.8 3.5 3.0* 3.5 3.1*  MG  --   --   --   --  2.0  PHOS  --   --   --   --  2.6   Anxiety/Depression -Continue SSRI   Morbid obesity  -Body mass index is 35.87 kg/m. Patient has been advised to make an attempt to improve diet and exercise patterns to aid in weight loss.  Marijuana use -Reports everyday marijuana smoking.  Unclear if patient has cannabis induced hyperemesis syndrome -Counseled to quit.  Mobility: Encourage ambulation Code Status:   Code Status: Full Code  Nutritional status: Body mass index is 35.87 kg/m.     Diet: Clear liquid diet for now. Diet Order             Diet clear liquid Room service appropriate? Yes; Fluid consistency: Thin  Diet effective now                  DVT prophylaxis:  SCDs Start: 04/08/21 1344   Antimicrobials: None Fluid: Currently on LR at 50 mill per hour. Consultants: GI Family Communication: called and updated patient's mom today.  Status is: Inpatient  Remains inpatient appropriate because: Persistent nausea and vomiting.  Poor oral intake  Dispo: The patient is from: Home              Anticipated d/c is to: Home in 1 to 2 days              Patient currently is not medically stable to d/c.   Difficult to place patient No  Infusions:   sodium chloride Stopped (04/10/21 2240)   lactated ringers 50 mL/hr at 04/11/21 1243   potassium chloride     promethazine (PHENERGAN) injection (IM or IVPB) 12.5 mg (04/10/21 2242)    Scheduled Meds:  DULoxetine  30 mg Oral Daily   lidocaine  15 mL Mouth/Throat Q4H   metoCLOPramide  5 mg Oral TID AC   norethindrone  1 tablet Oral Daily   nortriptyline  10 mg Oral QHS   ondansetron  4 mg Intravenous Q6H   pantoprazole  40 mg Oral BID AC   peg 3350 powder  0.5 kit Oral Once   sucralfate  1 g Oral TID WC & HS    Antimicrobials: Anti-infectives (From admission, onward)    None       PRN  meds: acetaminophen **OR** acetaminophen, phenol, promethazine (PHENERGAN) injection (IM or IVPB)   Objective: Vitals:   04/11/21 0553 04/11/21 0854  BP: 127/84 (!) 112/53  Pulse: 60 (!) 50  Resp: 18 17  Temp: 98.6 F (37 C)   SpO2: 100% 97%    Intake/Output Summary (Last 24 hours) at 04/11/2021 1302 Last data filed at 04/11/2021 0700 Gross per 24 hour  Intake 2372.91 ml  Output 200 ml  Net 2172.91 ml   Filed Weights   04/08/21 1800 04/08/21 2118 04/10/21 2105  Weight: 106.4 kg 106.4 kg 107 kg   Weight change:  Body mass index is 35.87 kg/m.   Physical Exam: General exam: Pleasant, young Caucasian female.  Not in distress Skin: No rashes, lesions or ulcers. HEENT: Atraumatic, normocephalic, no obvious bleeding Lungs: Clear to auscultation bilaterally CVS: Regular rate and rhythm, no murmur GI/Abd soft, mild tenderness in the epigastrium.  Nondistended, bowel sound present CNS: Alert, awake and oriented x3. Psychiatry: Mood appropriate Extremities: No pedal edema, no calf tenderness  Data Review: I have personally reviewed the laboratory data and studies available.  Recent Labs  Lab 04/06/21 1225 04/07/21 1225 04/08/21 0042 04/09/21 0351 04/10/21 0419  WBC 16.5* 26.8* 23.6* 22.2* 15.7*  NEUTROABS 14.4* 23.1* 22.1*  --  11.8*  HGB 15.8* 14.5 14.5 14.1 13.3  HCT 47.0* 43.0 44.3 41.7 39.5  MCV 85.8 85.3 86.7 85.8 84.4  PLT 513* 518* 518* 453* 410*   Recent Labs  Lab 04/06/21 1225 04/07/21 1225 04/08/21 0042 04/09/21 0351 04/10/21 0419  NA 136 136 136 136 134*  K 3.8 3.5 3.0* 3.5 3.1*  CL 105 104 103 103 99  CO2 19* 21* 21* 24 23  GLUCOSE 173* 121* 138* 113* 105*  BUN _0 CREATININE 0.81 0.72 0.78 0.76 0.75  CALCIUM 9.4 9.4 9.4 9.0 8.6*  MG  --   --   --   --  2.0  PHOS  --   --   --   --  2.6    F/u labs ordered Unresulted Labs (From admission, onward)     Start     Ordered   04/12/21 0500  CBC with Differential/Platelet  Daily,   R      Question:  Specimen collection method  Answer:  Lab=Lab collect   04/11/21 1251   04/12/21 3837  Basic metabolic panel  Daily,   R     Question:  Specimen collection method  Answer:  Lab=Lab collect   04/11/21 1251            Signed, Terrilee Croak, MD [facility] 04/11/2021

## 2021-04-11 NOTE — Progress Notes (Signed)
[facility] Progress Note    Since last GI note: Vomited twice overnight. Feels worse today than yesterday overall.  Objective: Vital signs in last 24 hours: Temp:  [98.5 F (36.9 C)-98.6 F (37 C)] 98.6 F (37 C) (08/31 0553) Pulse Rate:  [52-60] 60 (08/31 0553) Resp:  [18] 18 (08/31 0553) BP: (127-129)/(65-84) 127/84 (08/31 0553) SpO2:  [100 %] 100 % (08/31 0553) Weight:  [567 kg] 107 kg (08/30 2105) Last BM Date: 04/10/21 General: alert and oriented times 3 Heart: regular rate and rythm Abdomen: soft, non-tender, non-distended, normal bowel sounds  Lab Results: Recent Labs    04/09/21 0351 04/10/21 0419  WBC 22.2* 15.7*  HGB 14.1 13.3  PLT 453* 410*  MCV 85.8 84.4   Recent Labs    04/09/21 0351 04/10/21 0419  NA 136 134*  K 3.5 3.1*  CL 103 99  CO2 24 23  GLUCOSE 113* 105*  BUN 9 9  CREATININE 0.76 0.75  CALCIUM 9.0 8.6*   Medications: Scheduled Meds:  DULoxetine  30 mg Oral Daily   norethindrone  1 tablet Oral Daily   nortriptyline  10 mg Oral QHS   ondansetron  4 mg Intravenous Q6H   pantoprazole  40 mg Oral BID AC   peg 3350 powder  0.5 kit Oral Once   potassium chloride  40 mEq Oral Once   Continuous Infusions:  sodium chloride Stopped (04/10/21 2240)   lactated ringers 50 mL/hr at 04/10/21 1619   promethazine (PHENERGAN) injection (IM or IVPB) 12.5 mg (04/10/21 2242)   PRN Meds:.acetaminophen **OR** acetaminophen, phenol, promethazine (PHENERGAN) injection (IM or IVPB)  Assessment/Plan: 30 y.o. female with likely cannabis hyperemesis and abnormal colon on CT  Her vomiting has caused severe ulcerative esophagitis, she is on BID PPI for this.  Current antinausea regimen is iV zofran 84m Q6 hours, scheduled. Phenergan IV PRN.  I am going to add reglan 555mPO TID before meals and carafate suspension QID (AC and HS) scheduled.  Her diet should be advanced as tolerated.  Absolutely most important is that she completely [facility]ain  [facility] at least several months.   My office will arrange OV with me in several weeks and if she is up for colonoscopy at that point we will schedule it to evaluate abnormal colon on CT. She should remain on the above scheduled antinausea meds and BID PPI until that appt.  Please call or page with any further questions or concerns.   DaMilus BanisterMD  04/11/2021, 8:38 AM [facility] Pager [phone]

## 2021-04-12 LAB — CBC WITH DIFFERENTIAL/PLATELET
Abs Immature Granulocytes: 0.18 10*3/uL — ABNORMAL HIGH (ref 0.00–0.07)
Basophils Absolute: 0 10*3/uL (ref 0.0–0.1)
Basophils Relative: 0 %
Eosinophils Absolute: 0.2 10*3/uL (ref 0.0–0.5)
Eosinophils Relative: 2 %
HCT: 44.6 % (ref 36.0–46.0)
Hemoglobin: 15 g/dL (ref 12.0–15.0)
Immature Granulocytes: 1 %
Lymphocytes Relative: 19 %
Lymphs Abs: 2.7 10*3/uL (ref 0.7–4.0)
MCH: 29 pg (ref 26.0–34.0)
MCHC: 33.6 g/dL (ref 30.0–36.0)
MCV: 86.1 fL (ref 80.0–100.0)
Monocytes Absolute: 1.2 10*3/uL — ABNORMAL HIGH (ref 0.1–1.0)
Monocytes Relative: 9 %
Neutro Abs: 10.1 10*3/uL — ABNORMAL HIGH (ref 1.7–7.7)
Neutrophils Relative %: 69 %
Platelets: 391 10*3/uL (ref 150–400)
RBC: 5.18 MIL/uL — ABNORMAL HIGH (ref 3.87–5.11)
RDW: 13.2 % (ref 11.5–15.5)
WBC: 14.5 10*3/uL — ABNORMAL HIGH (ref 4.0–10.5)
nRBC: 0 % (ref 0.0–0.2)

## 2021-04-12 LAB — BASIC METABOLIC PANEL
Anion gap: 8 (ref 5–15)
BUN: 7 mg/dL (ref 6–20)
CO2: 26 mmol/L (ref 22–32)
Calcium: 8.4 mg/dL — ABNORMAL LOW (ref 8.9–10.3)
Chloride: 101 mmol/L (ref 98–111)
Creatinine, Ser: 0.85 mg/dL (ref 0.44–1.00)
GFR, Estimated: >60 mL/min (ref >60)
Glucose, Bld: 100 mg/dL — ABNORMAL HIGH (ref 70–99)
Potassium: 3.4 mmol/L — ABNORMAL LOW (ref 3.5–5.1)
Sodium: 135 mmol/L (ref 135–145)

## 2021-04-12 MED ORDER — PANTOPRAZOLE SODIUM 40 MG PO TBEC: 40.0000 mg | DELAYED_RELEASE_TABLET | Freq: Two times a day (BID) | ORAL | 0 refills | Status: DC

## 2021-04-12 MED ORDER — SUCRALFATE 1 GM/10ML PO SUSP: 1.0000 g | Freq: Three times a day (TID) | ORAL | 0 refills | Status: DC

## 2021-04-12 MED ORDER — METOCLOPRAMIDE HCL 5 MG PO TABS: 5.0000 mg | ORAL_TABLET | Freq: Three times a day (TID) | ORAL | 0 refills | Status: DC

## 2021-04-12 MED ORDER — LIDOCAINE VISCOUS HCL 2 % MT SOLN: 15.0000 mL | Freq: Four times a day (QID) | OROMUCOSAL | 0 refills | Status: AC | PRN

## 2021-04-12 NOTE — Discharge Summary (Signed)
Physician Discharge Summary  Diana Randolph:[id number] DOB: 1991-05-04 DOA: 04/07/2021  PCP: Loura Pardon, NP  Admit date: 04/07/2021 Discharge date: 04/12/2021  Admitted From: Home Discharge disposition: Home   Code Status: Full Code   Discharge Diagnosis:   Active Problems:   Nausea & vomiting   Intractable nausea and vomiting  Chief complaint: Intractable nausea, vomiting.  Brief narrative: Diana Randolph is a 30 y.o. female with PMH significant for obesity, anxiety, depression who presented to ED with complaint of abdominal pain, intractable nausea and vomiting for last 2 days which later turned her to have dark-colored emesis.  In the ED, patient was afebrile and hemodynamically stable. CT abdomen pelvis was obtained which showed possibility of Crohn's disease.   Urine drug screen was positive for THC and opiates. Admitted to hospitalist service GI consultation was obtained.  Subjective: Patient was seen and examined this morning. Not in vomiting improving.  Dysphagia improving.  Feels better with Carafate, lidocaine jelly.  Is ready to go home today.  Hospital course Intractable nausea and vomiting -Admitted for the same.  EGD 8/29 showed severe GERD/vomiting related ulcerative esophagitis.  -She was started on Protonix, Carafate, Reglan and viscous lidocaine after which she has been able to tolerate soft food. -Continue the same at home.  Abnormal CT scan finding -CT abdomen raised suspicion of Crohn's disease. -Patient was planned for colonoscopy inpatient but because of her inability to tolerate colonoscopy prep, the plan was postponed for outpatient.   Leukocytosis -WBC count and lactic acid level improved without antibiotics. Recent Labs  Lab 04/06/21 1308 04/07/21 1225 04/08/21 0042 04/09/21 0351 04/10/21 0419 04/12/21 0402  WBC  --  26.8* 23.6* 22.2* 15.7* 14.5*  LATICACIDVEN 4.5*  --   --   --  1.0  --    Hypokalemia -Potassium level low  because of poor oral intake and vomiting.  Improving with replacement. Recent Labs  Lab 04/07/21 1225 04/08/21 0042 04/09/21 0351 04/10/21 0419 04/12/21 0402  K 3.5 3.0* 3.5 3.1* 3.4*  MG  --   --   --  2.0  --   PHOS  --   --   --  2.6  --    Anxiety/Depression -Continue SSRI   Morbid obesity  -Body mass index is 35.87 kg/m. Patient has been advised to make an attempt to improve diet and exercise patterns to aid in weight loss.  Marijuana use -Reports everyday marijuana smoking.  Unclear if patient has cannabis induced hyperemesis syndrome -Counseled to quit.   Allergies as of 04/12/2021       Reactions   Azithromycin Diarrhea   Tape Itching, Other (See Comments)   Adhesive    Buspirone Palpitations        Medication List     STOP taking these medications    omeprazole 20 MG capsule Commonly known as: PRILOSEC       TAKE these medications    acetaminophen 500 MG tablet Commonly known as: TYLENOL Take 500-1,000 mg by mouth as needed (pain).   cetirizine 10 MG tablet Commonly known as: ZYRTEC Take 10 mg by mouth daily.   DULoxetine 30 MG capsule Commonly known as: CYMBALTA Take 30 mg by mouth at bedtime.   lidocaine 2 % solution Commonly known as: XYLOCAINE Use as directed 15 mLs in the mouth or throat every 6 (six) hours as needed for up to 10 days for mouth pain.   metoCLOPramide 5 MG tablet Commonly known as: REGLAN Take 1  tablet (5 mg total) by mouth 3 (three) times daily before meals for 7 days.   norethindrone 0.35 MG tablet Commonly known as: MICRONOR Take 1 tablet by mouth at bedtime.   ondansetron 4 MG disintegrating tablet Commonly known as: Zofran ODT Take 1 tablet (4 mg total) by mouth every 8 (eight) hours as needed for nausea or vomiting.   pantoprazole 40 MG tablet Commonly known as: PROTONIX Take 1 tablet (40 mg total) by mouth 2 (two) times daily before a meal.   predniSONE 10 MG (21) Tbpk tablet Commonly known as:  STERAPRED UNI-PAK 21 TAB Take by mouth daily. Take 6 tabs by mouth daily  for 2 days, then 5 tabs for 2 days, then 4 tabs for 2 days, then 3 tabs for 2 days, 2 tabs for 2 days, then 1 tab by mouth daily for 2 days   sucralfate 1 GM/10ML suspension Commonly known as: CARAFATE Take 10 mLs (1 g total) by mouth 4 (four) times daily -  with meals and at bedtime for 10 days.        Discharge Instructions:  Diet Recommendation:  Discharge Diet Orders (From admission, onward)     Start     Ordered   04/12/21 0000  Diet general       Comments: Soft diet for next few days and advance to regular as tolerated.   04/12/21 1306              Follow with Primary MD Loura PardonKrebs, Amy Lauren, NP in 7 days   Get CBC/BMP checked in next visit within 1 week by PCP or SNF MD ( we routinely change or add medications that can affect your baseline labs and fluid status, therefore we recommend that you get the mentioned basic workup next visit with your PCP, your PCP may decide not to get them or add new tests based on their clinical decision)  On your next visit with your PCP, please Get Medicines reviewed and adjusted.  Please request your PCP  to go over all Hospital Tests and Procedure/Radiological results at the follow up, please get all Hospital records sent to your Prim MD by signing hospital release before you go home.  Activity: As tolerated with Full fall precautions use walker/cane & assistance as needed  For Heart failure patients - Check your Weight same time everyday, if you gain over 2 pounds, or you develop in leg swelling, experience more shortness of breath or chest pain, call your Primary MD immediately. Follow Cardiac Low Salt Diet and 1.5 lit/day fluid restriction.  If you have smoked or chewed Tobacco in the last 2 yrs please stop smoking, stop any regular Alcohol  and or any Recreational drug use.  If you experience worsening of your admission symptoms, develop shortness of breath,  life threatening emergency, suicidal or homicidal thoughts you must seek medical attention immediately by calling 911 or calling your MD immediately  if symptoms less severe.  You Must read complete instructions/literature along with all the possible adverse reactions/side effects for all the Medicines you take and that have been prescribed to you. Take any new Medicines after you have completely understood and accpet all the possible adverse reactions/side effects.   Do not drive, operate heavy machinery, perform activities at heights, swimming or participation in water activities or provide baby sitting services if your were admitted for syncope or siezures until you have seen by Primary MD or a Neurologist and advised to do so again.  Do  not drive when taking Pain medications.  Do not take more than prescribed Pain, Sleep and Anxiety Medications  Wear Seat belts while driving.   Please note You were cared for by a hospitalist during your hospital stay. If you have any questions about your discharge medications or the care you received while you were in the hospital after you are discharged, you can call the unit and asked to speak with the hospitalist on call if the hospitalist that took care of you is not available. Once you are discharged, your primary care physician will handle any further medical issues. Please note that NO REFILLS for any discharge medications will be authorized once you are discharged, as it is imperative that you return to your primary care physician (or establish a relationship with a primary care physician if you do not have one) for your aftercare needs so that they can reassess your need for medications and monitor your lab values.    Follow ups:    Follow-up Information     Loura Pardon, NP Follow up.   Specialty: Family Medicine        Rachael Fee, MD Follow up.   Specialty: Gastroenterology Contact information: [address] [place]  [address] [phone]                 Wound care:     Discharge Exam:   Vitals:   04/11/21 0553 04/11/21 0854 04/11/21 1803 04/11/21 2027  BP: 127/84 (!) 112/53 118/86 126/78  Pulse: 60 (!) 50 (!) 56 (!) 55  Resp: 18 17 17 19   Temp: 98.6 F (37 C)   99 F (37.2 C)  TempSrc: Oral   Oral  SpO2: 100% 97% 99% 97%  Weight:      Height:        Body mass index is 35.87 kg/m.  General exam: Pleasant, young Caucasian female.  Not in distress Skin: No rashes, lesions or ulcers. HEENT: Atraumatic, normocephalic, no obvious bleeding Lungs: Clear to auscultation bilaterally CVS: Regular rate and rhythm, no murmur GI/Abd soft, mild epigastric tenderness, nondistended, bowel sound present CNS: Alert, awake, oriented x3 Psychiatry: Mood appropriate Extremities: No pedal edema, no calf tenderness  Time coordinating discharge: 35 minutes   The results of significant diagnostics from this hospitalization (including imaging, microbiology, ancillary and laboratory) are listed below for reference.    Procedures and Diagnostic Studies:   DG Abdomen Acute W/Chest  Result Date: 04/07/2021 CLINICAL DATA:  Vomiting and abdominal pain. EXAM: DG ABDOMEN ACUTE WITH 1 VIEW CHEST COMPARISON:  None. FINDINGS: There is no evidence of dilated bowel loops or free intraperitoneal air. No radiopaque calculi or other significant radiographic abnormality is seen. Heart size and mediastinal contours are within normal limits. Both lungs are clear. IMPRESSION: Negative abdominal radiographs.  No acute cardiopulmonary disease. Electronically Signed   By: 04/09/2021 M.D.   On: 04/07/2021 23:39     Labs:   Basic Metabolic Panel: Recent Labs  Lab 04/07/21 1225 04/08/21 0042 04/09/21 0351 04/10/21 0419 04/12/21 0402  NA 136 136 136 134* 135  K 3.5 3.0* 3.5 3.1* 3.4*  CL 104 103 103 99 101  CO2 21* 21* 24 23 26   GLUCOSE 121* 138* 113* 105* 100*  BUN 8 10 9 9 7   CREATININE 0.72 0.78 0.76  0.75 0.85  CALCIUM 9.4 9.4 9.0 8.6* 8.4*  MG  --   --   --  2.0  --   PHOS  --   --   --  2.6  --    GFR Estimated Creatinine Clearance: 127.3 mL/min (by C-G formula based on SCr of 0.85 mg/dL). Liver Function Tests: Recent Labs  Lab 04/06/21 1225 04/07/21 1225 04/08/21 0042  AST ALT ALKPHOS 51 54 57  BILITOT 1.4* 1.5* 1.7*  PROT 7.1 7.4 7.5  ALBUMIN 4.1 4.1 4.1   Recent Labs  Lab 04/06/21 1225 04/07/21 1225 04/08/21 0927  LIPASE No results for input(s): AMMONIA in the last 168 hours. Coagulation profile No results for input(s): INR, PROTIME in the last 168 hours.  CBC: Recent Labs  Lab 04/06/21 1225 04/07/21 1225 04/08/21 0042 04/09/21 0351 04/10/21 0419 04/12/21 0402  WBC 16.5* 26.8* 23.6* 22.2* 15.7* 14.5*  NEUTROABS 14.4* 23.1* 22.1*  --  11.8* 10.1*  HGB 15.8* 14.5 14.5 14.1 13.3 15.0  HCT 47.0* 43.0 44.3 41.7 39.5 44.6  MCV 85.8 85.3 86.7 85.8 84.4 86.1  PLT 513* 518* 518* 453* 410* 391   Cardiac Enzymes: No results for input(s): CKTOTAL, CKMB, CKMBINDEX, TROPONINI in the last 168 hours. BNP: Invalid input(s): POCBNP CBG: No results for input(s): GLUCAP in the last 168 hours. D-Dimer No results for input(s): DDIMER in the last 72 hours. Hgb A1c No results for input(s): HGBA1C in the last 72 hours. Lipid Profile No results for input(s): CHOL, HDL, LDLCALC, TRIG, CHOLHDL, LDLDIRECT in the last 72 hours. Thyroid function studies No results for input(s): TSH, T4TOTAL, T3FREE, THYROIDAB in the last 72 hours.  Invalid input(s): FREET3 Anemia work up No results for input(s): VITAMINB12, FOLATE, FERRITIN, TIBC, IRON, RETICCTPCT in the last 72 hours. Microbiology Recent Results (from the past 240 hour(s))  Resp Panel by RT-PCR (Flu A&B, Covid) Nasopharyngeal Swab     Status: None   Collection Time: 04/06/21 12:25 PM   Specimen: Nasopharyngeal Swab; Nasopharyngeal(NP) swabs in vial transport medium  Result Value Ref  Range Status   SARS Coronavirus 2 by RT PCR NEGATIVE NEGATIVE Final    Comment: (NOTE) SARS-CoV-2 target nucleic acids are NOT DETECTED.  The SARS-CoV-2 RNA is generally detectable in upper respiratory specimens during the acute phase of infection. The lowest concentration of SARS-CoV-2 viral copies this assay can detect is 138 copies/mL. A negative result does not preclude SARS-Cov-2 infection and should not be used as the sole basis for treatment or other patient management decisions. A negative result may occur with  improper specimen collection/handling, submission of specimen other than nasopharyngeal swab, presence of viral mutation(s) within the areas targeted by this assay, and inadequate number of viral copies(<138 copies/mL). A negative result must be combined with clinical observations, patient history, and epidemiological information. The expected result is Negative.  Fact Sheet for Patients:  BloggerCourse.com  Fact Sheet for Healthcare Providers:  SeriousBroker.it  This test is no t yet approved or cleared by the [place] FDA and  has been authorized for detection and/or diagnosis of SARS-CoV-2 by FDA under an Emergency Use Authorization (EUA). This EUA will remain  in effect (meaning this test can be used) for the duration of the COVID-19 declaration under Section 564(b)(1) of the Act, 21 U.S.C.section 360bbb-3(b)(1), unless the authorization is terminated  or revoked sooner.       Influenza A by PCR NEGATIVE NEGATIVE Final   Influenza B by PCR NEGATIVE NEGATIVE Final    Comment: (NOTE) The Xpert Xpress SARS-CoV-2/FLU/RSV plus assay is intended as an aid in the diagnosis of influenza from Nasopharyngeal swab specimens and  should not be used as a sole basis for treatment. Nasal washings and aspirates are unacceptable for Xpert Xpress SARS-CoV-2/FLU/RSV testing.  Fact Sheet for  Patients: BloggerCourse.com  Fact Sheet for Healthcare Providers: SeriousBroker.it  This test is not yet approved or cleared by the [place] FDA and has been authorized for detection and/or diagnosis of SARS-CoV-2 by FDA under an Emergency Use Authorization (EUA). This EUA will remain in effect (meaning this test can be used) for the duration of the COVID-19 declaration under Section 564(b)(1) of the Act, 21 U.S.C. section 360bbb-3(b)(1), unless the authorization is terminated or revoked.  Performed at [facility], [address], [place], [place] [address]   Resp Panel by RT-PCR (Flu A&B, Covid) Nasopharyngeal Swab     Status: None   Collection Time: 04/08/21  1:42 PM   Specimen: Nasopharyngeal Swab; Nasopharyngeal(NP) swabs in vial transport medium  Result Value Ref Range Status   SARS Coronavirus 2 by RT PCR NEGATIVE NEGATIVE Final    Comment: (NOTE) SARS-CoV-2 target nucleic acids are NOT DETECTED.  The SARS-CoV-2 RNA is generally detectable in upper respiratory specimens during the acute phase of infection. The lowest concentration of SARS-CoV-2 viral copies this assay can detect is 138 copies/mL. A negative result does not preclude SARS-Cov-2 infection and should not be used as the sole basis for treatment or other patient management decisions. A negative result may occur with  improper specimen collection/handling, submission of specimen other than nasopharyngeal swab, presence of viral mutation(s) within the areas targeted by this assay, and inadequate number of viral copies(<138 copies/mL). A negative result must be combined with clinical observations, patient history, and epidemiological information. The expected result is Negative.  Fact Sheet for Patients:  BloggerCourse.com  Fact Sheet for Healthcare Providers:  SeriousBroker.it  This test is no t yet  approved or cleared by the [place] FDA and  has been authorized for detection and/or diagnosis of SARS-CoV-2 by FDA under an Emergency Use Authorization (EUA). This EUA will remain  in effect (meaning this test can be used) for the duration of the COVID-19 declaration under Section 564(b)(1) of the Act, 21 U.S.C.section 360bbb-3(b)(1), unless the authorization is terminated  or revoked sooner.       Influenza A by PCR NEGATIVE NEGATIVE Final   Influenza B by PCR NEGATIVE NEGATIVE Final    Comment: (NOTE) The Xpert Xpress SARS-CoV-2/FLU/RSV plus assay is intended as an aid in the diagnosis of influenza from Nasopharyngeal swab specimens and should not be used as a sole basis for treatment. Nasal washings and aspirates are unacceptable for Xpert Xpress SARS-CoV-2/FLU/RSV testing.  Fact Sheet for Patients: BloggerCourse.com  Fact Sheet for Healthcare Providers: SeriousBroker.it  This test is not yet approved or cleared by the [place] FDA and has been authorized for detection and/or diagnosis of SARS-CoV-2 by FDA under an Emergency Use Authorization (EUA). This EUA will remain in effect (meaning this test can be used) for the duration of the COVID-19 declaration under Section 564(b)(1) of the Act, 21 U.S.C. section 360bbb-3(b)(1), unless the authorization is terminated or revoked.  Performed at [facility], [address], [place], [place] [address]      Signed: Lorin Glass  [facility] 04/12/2021, 1:06 PM

## 2021-04-12 NOTE — Progress Notes (Signed)
Patient went home with mother this afternoon. Information packet reviewed and no further questions noted. Taken down via wheelchair. Diana Randolph

## 2021-04-24 LAB — SURGICAL PATHOLOGY

## 2023-04-25 IMAGING — CR DG ABDOMEN ACUTE W/ 1V CHEST
4 series · 4 of 4 positions shown · non-contrast
Comparison: None.

CLINICAL DATA: Vomiting and abdominal pain.

EXAM:
DG ABDOMEN ACUTE WITH 1 VIEW CHEST

[chest pa]
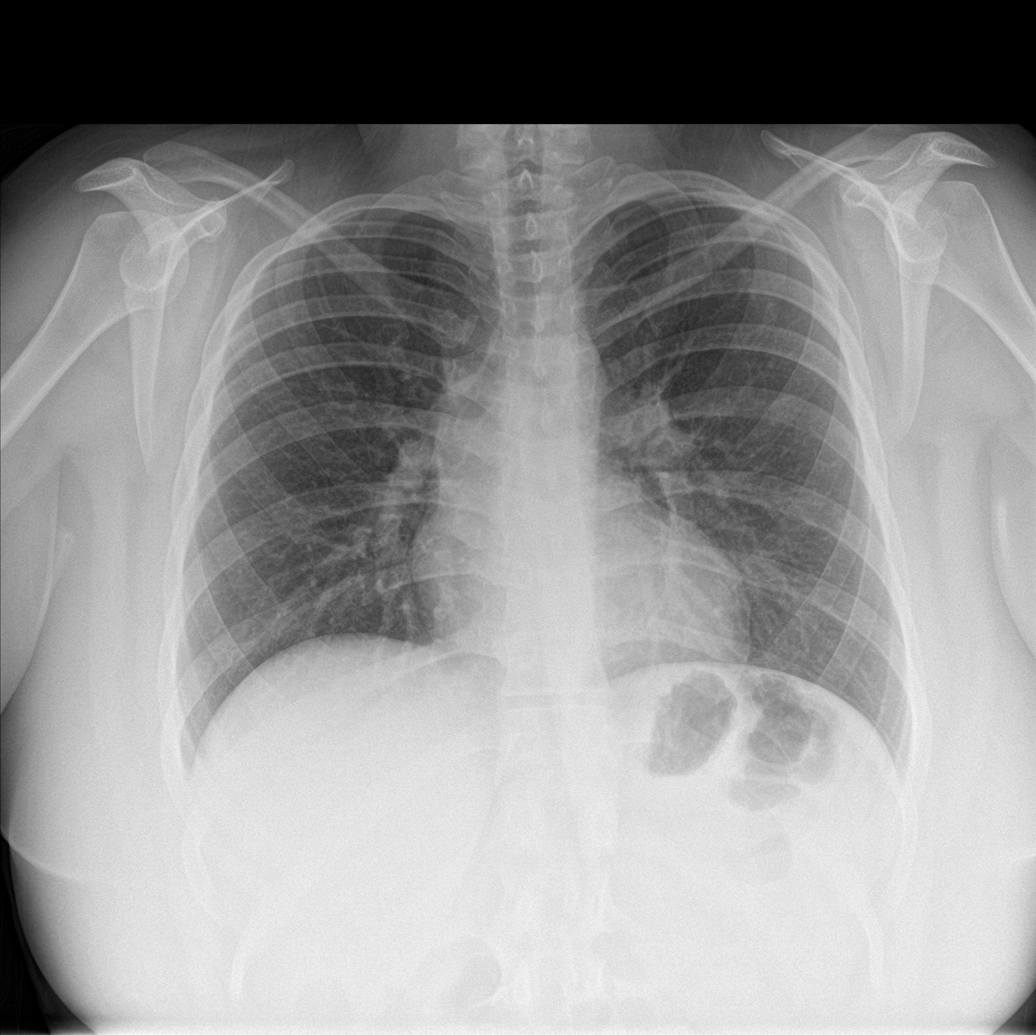

[abdomen erect]
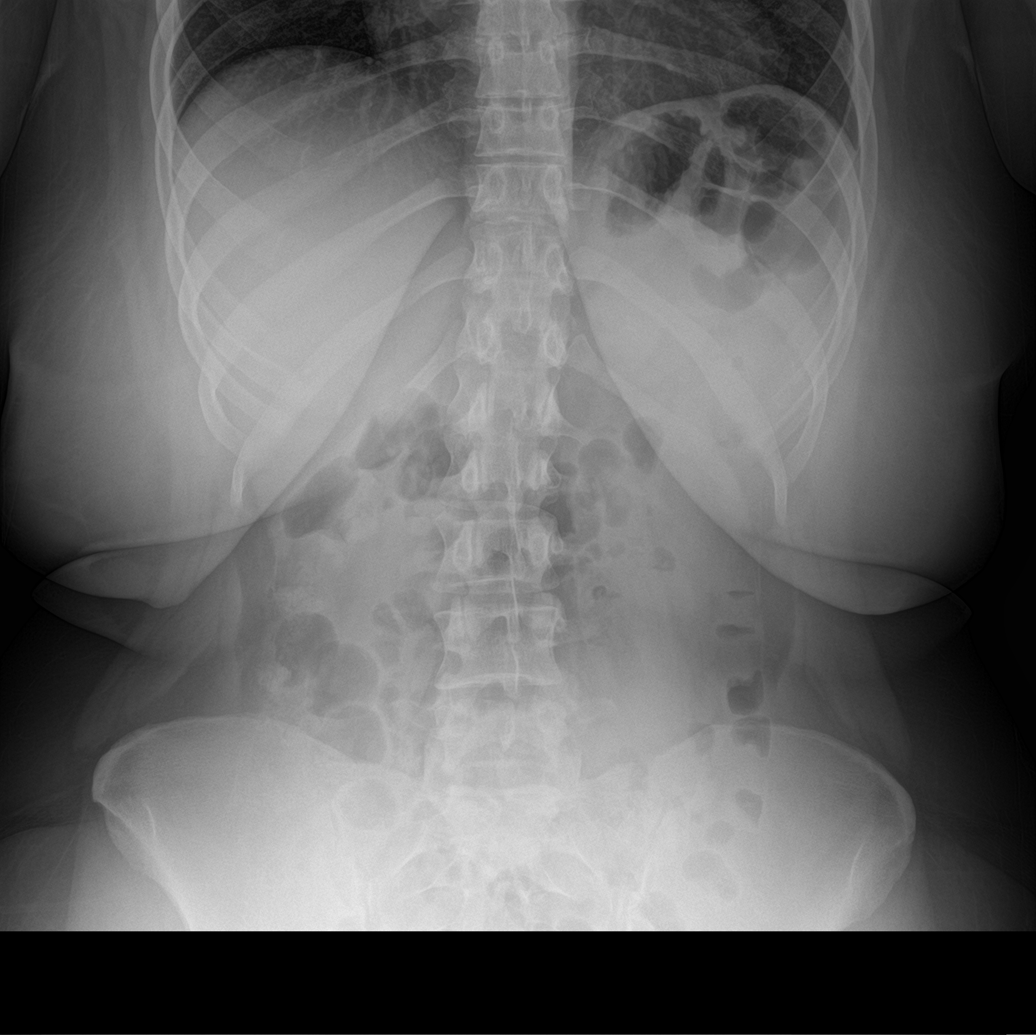

[abdomen supine (1 of 2)]
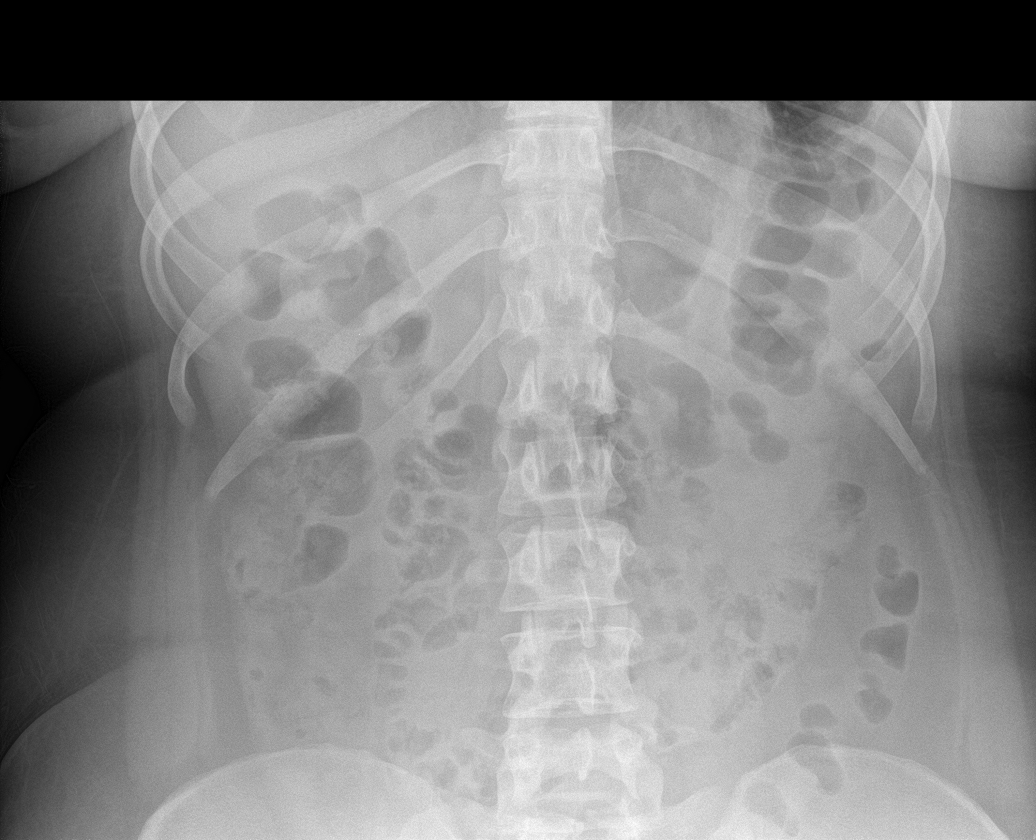

[abdomen supine (2 of 2)]
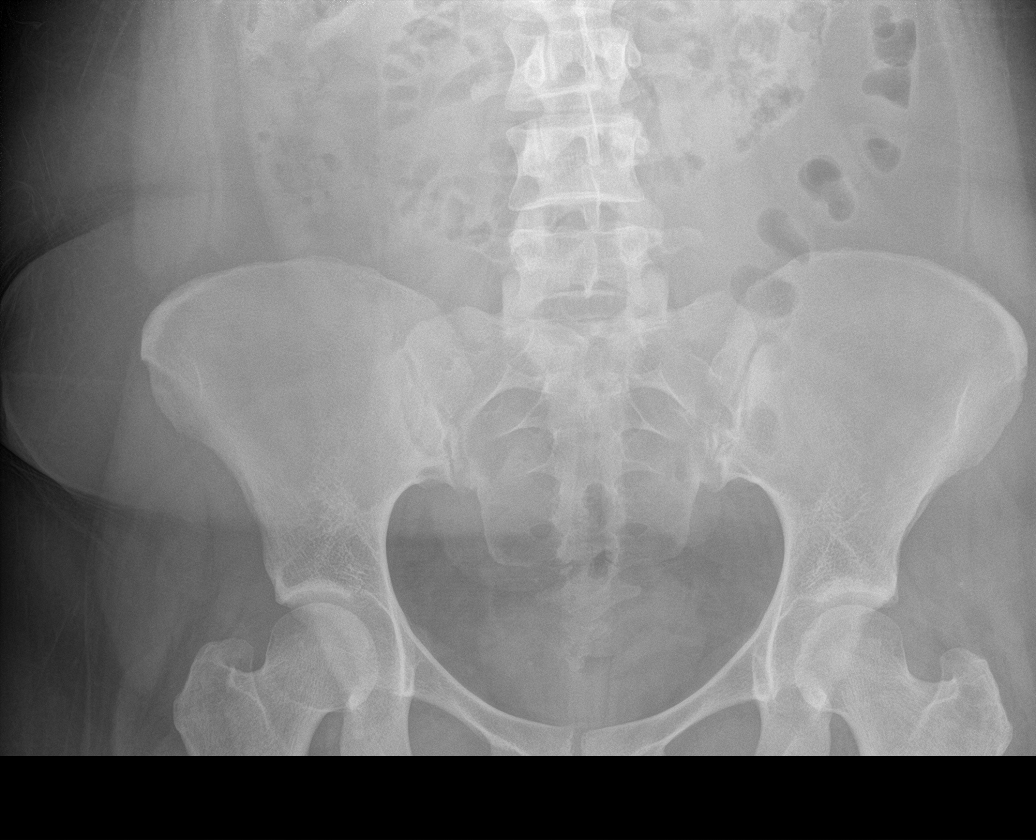

[4 of 4 positions shown; findings below may reference images not displayed]

FINDINGS: There is no evidence of dilated bowel loops or free intraperitoneal
air. No radiopaque calculi or other significant radiographic
abnormality is seen. Heart size and mediastinal contours are within
normal limits. Both lungs are clear.
IMPRESSION: Negative abdominal radiographs.  No acute cardiopulmonary disease.

## 2023-05-20 ENCOUNTER — Encounter: Payer: Self-pay | Admitting: Neurology

## 2023-05-20 ENCOUNTER — Ambulatory Visit: Payer: BC Managed Care – PPO | Admitting: Neurology

## 2023-05-20 NOTE — Progress Notes (Deleted)
[facility]    Provider:  Dr Lucia Gaskins Requesting Provider: Dawley, Alan Mulder, DO Primary Care Provider:  Loura Pardon, NP  CC:  ***  HPI:  Diana Randolph is a 32 y.o. female here as requested by Dawley, Alan Mulder, DO for IDIOPATHIC INTRACRANIAL HYPERTENSION. has Nausea & vomiting and Intractable nausea and vomiting on their problem list.  I reviewed Dr. Mattie Marlin notes from [place] [facility].  Also patient was seen at [facility] neurology from 20 18-20 23.  She has a history of IIH and was initially diagnosed in fall 2018 but last summer had her worst episode of symptoms yet.  She had severe headaches at that time currently managed by neurology PA in [place].  She is on 500 mg twice daily of Diamox tolerating it well.  She does have intermittent headaches when he saw her in July of this year she reported a 3 out of 10, she denied any vision loss, she complained of some peripheral vision splotches, her intermittently would have dizzy episodes, pressure type pain behind her bilateral eyes, also many episodes of near syncope, intermittent tingling of her hands and toes as well, MRI in June and underwent a lumbar puncture last year with an opening pressure of 25.  Last ophthalmology visit was August 2023 that showed no optic edema.  Diet and weight loss were recommended.  She was tentatively scheduled with an ophthalmologist in August for follow-up exam.  And no evidence of optic nerve edema on exam in August 2023.   Reviewed notes, labs and imaging from outside physicians, which showed ***  Review of Systems: Patient complains of symptoms per HPI as well as the following symptoms ***. Pertinent negatives and positives per HPI. All others negative.   Social History   Socioeconomic History   Marital status: Single    Spouse name: Not on file   Number of children: Not on file   Years of education: Not on file   Highest education level: Not on file  Occupational History   Not  on file  Tobacco Use   Smoking status: Former    Current packs/day: 0.00    Types: Cigarettes    Quit date: 06/23/2017    Years since quitting: 5.9   Smokeless tobacco: Never  Vaping Use   Vaping status: Never Used  Substance and Sexual Activity   Alcohol use: Yes    Alcohol/week: 0.0 standard drinks of alcohol   Drug use: Yes    Comment: marijuana   Sexual activity: Yes  Other Topics Concern   Not on file  Social History Narrative   Not on file   Social Determinants of Health   Financial Resource Strain: Not on file  Food Insecurity: Not on file  Transportation Needs: Not on file  Physical Activity: Not on file  Stress: Not on file  Social Connections: Unknown (12/25/2021)   Received from [facility], [facility]   Social Network    Social Network: Not on file  Intimate Partner Violence: Unknown (11/16/2021)   Received from [facility], [facility]   HITS    Physically Hurt: Not on file    Insult or Talk Down To: Not on file    Threaten Physical Harm: Not on file    Scream or Curse: Not on file    Family History  Problem Relation Age of Onset   Healthy Mother    Heart attack Father     No past medical history on file.  Patient  Active Problem List   Diagnosis Date Noted   Nausea & vomiting 04/08/2021   Intractable nausea and vomiting 04/08/2021    Past Surgical History:  Procedure Laterality Date   BIOPSY  04/09/2021   Procedure: BIOPSY;  Surgeon: Rachael Fee, MD;  Location: [facility] ENDOSCOPY;  Service: Endoscopy;;   ESOPHAGOGASTRODUODENOSCOPY (EGD) WITH PROPOFOL N/A 04/09/2021   Procedure: ESOPHAGOGASTRODUODENOSCOPY (EGD) WITH PROPOFOL;  Surgeon: Rachael Fee, MD;  Location: [facility] ENDOSCOPY;  Service: Endoscopy;  Laterality: N/A;    Current Outpatient Medications  Medication Sig Dispense Refill   acetaminophen (TYLENOL) 500 MG tablet Take 500-1,000 mg by mouth as needed (pain).     cetirizine (ZYRTEC) 10 MG tablet Take 10 mg by mouth daily.      DULoxetine (CYMBALTA) 30 MG capsule Take 30 mg by mouth at bedtime.  5   metoCLOPramide (REGLAN) 5 MG tablet Take 1 tablet (5 mg total) by mouth 3 (three) times daily before meals for 7 days. 21 tablet 0   norethindrone (MICRONOR,CAMILA,ERRIN) 0.35 MG tablet Take 1 tablet by mouth at bedtime.  6   ondansetron (ZOFRAN ODT) 4 MG disintegrating tablet Take 1 tablet (4 mg total) by mouth every 8 (eight) hours as needed for nausea or vomiting. 20 tablet 0   pantoprazole (PROTONIX) 40 MG tablet Take 1 tablet (40 mg total) by mouth 2 (two) times daily before a meal. 60 tablet 0   sucralfate (CARAFATE) 1 GM/10ML suspension Take 10 mLs (1 g total) by mouth 4 (four) times daily -  with meals and at bedtime for 10 days. 414 mL 0   No current facility-administered medications for this visit.    Allergies as of 05/20/2023 - Review Complete 04/10/2021  Allergen Reaction Noted   Azithromycin Diarrhea 10/26/2015   Tape Itching and Other (See Comments) 05/10/2018   Buspirone Palpitations 01/01/2018    Vitals: There were no vitals taken for this visit. Last Weight:  Wt Readings from Last 1 Encounters:  04/10/21 235 lb 14.3 oz (107 kg)   Last Height:   Ht Readings from Last 1 Encounters:  04/08/21 5\' 8"  (1.727 m)     Physical exam: Exam: Gen: NAD, conversant, well nourised, obese, well groomed                     CV: RRR, no MRG. No Carotid Bruits. No peripheral edema, warm, nontender Eyes: Conjunctivae clear without exudates or hemorrhage  Neuro: Detailed Neurologic Exam  Speech:    Speech is normal; fluent and spontaneous with normal comprehension.  Cognition:    The patient is oriented to person, place, and time;     recent and remote memory intact;     language fluent;     normal attention, concentration,     fund of knowledge Cranial Nerves:    The pupils are equal, round, and reactive to light. The fundi are normal and spontaneous venous pulsations are present. Visual fields are  full to finger confrontation. Extraocular movements are intact. Trigeminal sensation is intact and the muscles of mastication are normal. The face is symmetric. The palate elevates in the midline. Hearing intact. Voice is normal. Shoulder shrug is normal. The tongue has normal motion without fasciculations.   Coordination:    Normal finger to nose and heel to shin. Normal rapid alternating movements.   Gait:    Heel-toe and tandem gait are normal.   Motor Observation:    No asymmetry, no atrophy, and no involuntary movements noted. Tone:  Normal muscle tone.    Posture:    Posture is normal. normal erect    Strength:    Strength is V/V in the upper and lower limbs.      Sensation: intact to LT     Reflex Exam:  DTR's:    Deep tendon reflexes in the upper and lower extremities are normal bilaterally.   Toes:    The toes are downgoing bilaterally.   Clonus:    Clonus is absent.    Assessment/Plan:    No orders of the defined types were placed in this encounter.  No orders of the defined types were placed in this encounter.   Cc: Dawley, Alan Mulder, DO,  Loura Pardon, NP  Naomie Dean, MD  [facility] [address] [place], [place] [address]  Phone [phone] Fax [phone]

## 2023-09-02 ENCOUNTER — Encounter: Payer: Self-pay | Admitting: Neurology

## 2023-09-02 ENCOUNTER — Ambulatory Visit: Payer: 59 | Admitting: Neurology

## 2023-09-02 VITALS — BP 133/91 | HR 107 | Ht 65.0 in | Wt 210.0 lb

## 2023-09-02 DIAGNOSIS — G932 Benign intracranial hypertension: Secondary | ICD-10-CM

## 2023-09-02 DIAGNOSIS — Z79899 Other long term (current) drug therapy: Secondary | ICD-10-CM | POA: Diagnosis not present

## 2023-09-02 DIAGNOSIS — R55 Syncope and collapse: Secondary | ICD-10-CM | POA: Diagnosis not present

## 2023-09-02 MED ORDER — ACETAZOLAMIDE 250 MG PO TABS: 500.0000 mg | ORAL_TABLET | Freq: Two times a day (BID) | ORAL | 4 refills | Status: AC

## 2023-09-02 NOTE — Progress Notes (Signed)
[facility]    Provider:  Dr Lucia Gaskins Requesting Provider: Loura Pardon, NP Primary Care Provider:  Loura Pardon, NP  CC:  syncope, likely vasovagal  HPI:  Diana Randolph is a 33 y.o. female here as requested by Loura Pardon, NP in 2018 with IDIOPATHIC INTRACRANIAL HYPERTENSION. Her neurologist at the time put her on cymbalta and topamax at that time. She was alson and anti-anxiety medication. She went into the hospital in 04/02/2021 and was in there in 6 days for marijuana hyperemesis syndrome and she could not take her medications sp she reached out to her neurologist and they decided to come off meds and from 03/2021 until the summer of 2023 she had no headaches. Summer of 2023 she started getting a headache, flashing lights, nausea, pulsating/pounding/throbbing and got better laying down, sitting and standing made it worse. She saw her neurologist again and she had imaging and an LP and she was on Diamox when getting the lumbar puncture and took it down to 14 and she felt better and since then she has not had significant headaches she has mild headaches sometimes in the occipital area. She still has dizzy symptoms but no headaches. She gets dizzy from standing up too long. When standing too long she gets dizzy/lightheaded, feels like she may pass out if she stands too long, when she sits down she feels better. She can be at the grocery store and take a minute or hold on to the buggy.  She has this feeling a few times a week.  Reviewed notes, labs and imaging from outside physicians, which showed;  02/04/2022: MRI brain  TECHNIQUE/PROTOCOL: Standard adult brain protocol without and with IV  contrast.   FINDINGS:  Brain Parenchyma:  No hemorrhage, cerebral edema, acute cortical  infarction, mass, mass effect, or midline shift.  No abnormal enhancement.  Partially empty and expanded bony sella.  Ventricles and Sulci: Normal for age.   Extra-Axial Spaces: No  extra-axial fluid collection.  Basal Cisterns: Normal.  Intracranial Flow-Voids: Symmetric bilateral narrowing of the transverse  sigmoid junctions   Paranasal Sinuses: Normal.  Mastoid air cells: well aerated  Orbits: Possible flattening of the bilateral posterior globes at the  insertion of the optic nerves.  Cranium: Normal.    IMPRESSION:  There are multiple findings which are nonspecific though suggestive of  intracranial hypertension. Consider correlation with LP and opening  pressures.   Reviewed notes from ophthalmology: 03/2022:  -on diamox 250mg -500mg  bid, headaches and ocular symptoms have improved,  no hx of transient vision loss, nonspecific "splotches" of vision  temporally Left Eye>Right Eye transient and improving  -under care of [facility]y of [facility]  -HVF 24-2:wnl ou today  - no signs of optic disc edema ou  -cont fu with neurology      Review of Systems: Patient complains of symptoms per HPI as well as the following symptoms none. Pertinent negatives and positives per HPI. All others negative.   Social History   Socioeconomic History   Marital status: Single    Spouse name: Not on file   Number of children: Not on file   Years of education: Not on file   Highest education level: Not on file  Occupational History   Not on file  Tobacco Use   Smoking status: Former    Current packs/day: 0.00    Types: Cigarettes    Quit date: 06/23/2017    Years since quitting: 6.2   Smokeless tobacco: Never  Vaping Use   Vaping status: Never Used  Substance and Sexual Activity   Alcohol use: Yes    Alcohol/week: 0.0 standard drinks of alcohol   Drug use: Yes    Comment: marijuana   Sexual activity: Yes  Other Topics Concern   Not on file  Social History Narrative   Not on file   Social Drivers of Health   Financial Resource Strain: Not on file  Food Insecurity: Not on file  Transportation Needs: Not on file  Physical Activity: Not on file   Stress: Not on file  Social Connections: Unknown (12/25/2021)   [facility]: [facility]   Social Network    Social Network: Not on file  Intimate Partner Violence: Unknown (11/16/2021)   Received from [facility], [facility]   HITS    Physically Hurt: Not on file    Insult or Talk Down To: Not on file    Threaten Physical Harm: Not on file    Scream or Curse: Not on file    Family History  Problem Relation Age of Onset   Healthy Mother    Heart attack Father     Past Medical History:  Diagnosis Date   IIH (idiopathic intracranial hypertension)     Patient Active Problem List   Diagnosis Date Noted   Nausea & vomiting 04/08/2021   Intractable nausea and vomiting 04/08/2021    Past Surgical History:  Procedure Laterality Date   BIOPSY  04/09/2021   Procedure: BIOPSY;  Surgeon: Rachael Fee, MD;  Location[facility] ENDOSCOPY;  Service: Endoscopy;;   ESOPHAGOGASTRODUODENOSCOPY (EGD) WITH PROPOFOL N/A 04/09/2021   Procedure: ESOPHAGOGASTRODUODENOSCOPY (EGD) WITH PROPOFOL;  Surgeon: Rachael Fee, MD;  Location: [facility] ENDOSCOPY;  Service: Endoscopy;  Laterality: N/A;    Current Outpatient Medications  Medication Sig Dispense Refill   acetaminophen (TYLENOL) 500 MG tablet Take 500-1,000 mg by mouth as needed (pain).     acetaZOLAMIDE (DIAMOX) 250 MG tablet Take 2 tablets (500 mg total) by mouth 2 (two) times daily. 360 tablet 4   norethindrone (MICRONOR,CAMILA,ERRIN) 0.35 MG tablet Take 1 tablet by mouth at bedtime.  6   No current facility-administered medications for this visit.    Allergies as of 09/02/2023 - Review Complete 09/02/2023  Allergen Reaction Noted   Azithromycin Diarrhea 10/26/2015   Tape Itching and Other (See Comments) 05/10/2018   Buspirone Palpitations 01/01/2018    Vitals: BP (!) 133/91 (BP Location: Left Arm, Patient Position: Sitting, Cuff Size: Normal)   Pulse (!) 107   Ht 5\' 5"  (1.651 m)   Wt 210 lb (95.3 kg)   BMI 34.95  kg/m  Last Weight:  Wt Readings from Last 1 Encounters:  09/02/23 210 lb (95.3 kg)   Last Height:   Ht Readings from Last 1 Encounters:  09/02/23 5\' 5"  (1.651 m)     Physical exam: Exam: Gen: NAD, conversant, well nourised, obese, well groomed                     CV: RRR, no MRG. No Carotid Bruits. No peripheral edema, warm, nontender Eyes: Conjunctivae clear without exudates or hemorrhage  Neuro: Detailed Neurologic Exam  Speech:    Speech is normal; fluent and spontaneous with normal comprehension.  Cognition:    The patient is oriented to person, place, and time;     recent and remote memory intact;     language fluent;     normal attention, concentration,  fund of knowledge Cranial Nerves:    The pupils are equal, round, and reactive to light. The fundi are normal and spontaneous venous pulsations are present. Visual fields are full to finger confrontation. Extraocular movements are intact. Trigeminal sensation is intact and the muscles of mastication are normal. The face is symmetric. The palate elevates in the midline. Hearing intact. Voice is normal. Shoulder shrug is normal. The tongue has normal motion without fasciculations.   Coordination:    Normal finger to nose and heel to shin. Normal rapid alternating movements.   Gait:    Heel-toe and tandem gait are normal.   Motor Observation:    No asymmetry, no atrophy, and no involuntary movements noted. Tone:    Normal muscle tone.    Posture:    Posture is normal. normal erect    Strength:    Strength is V/V in the upper and lower limbs.      Sensation: intact to LT     Reflex Exam:  DTR's:    Deep tendon reflexes in the upper and lower extremities are normal bilaterally.   Toes:    The toes are downgoing bilaterally.   Clonus:    Clonus is absent.    Assessment/Plan:  Doing well on actazolamide. Has lost 40 pounds and appears to be having side effects to the diamox I think at this time we can  decrease her from 500mg  bid to 250mg  bid. Also he episodes appear vasovagal and we discussed that at length as below  Medication effect (diamox) vs Vasovagal syncope vs Cardiac Cause(see primary care for evaluation)  Can try decreasing the Diamox, if symptoms recur go back to last dose. Decrease to 1 pill in the morning and 2 at night for 1-2 weeks then decrease to 1 pill twice daily  F/u at least yearly with ophthalmology, make appt has not gone since 03/2022  Continue weight loss  To avoid vasovagal syncope, you can: stay hydrated by drinking plenty of fluids, avoid standing for long periods, eat regular meals with adequate salt, recognize and manage triggers like the sight of blood, change positions slowly, and if you feel symptoms coming on, sit or lie down with your legs elevated  Vasovagal syncope: also known as neurocardiogenic syncope, is a type of fainting that occurs when the body's reaction to a trigger causes a sudden drop in blood pressure and heart rate. It's the most common cause of fainting.  Causes  -Extreme fear or pain -Seeing something that disgusts you, like blood -Standing for a long time -Stressful life events or emotional strain Symptoms  -Dizziness -Feeling hot or cold -Nausea -Pale skin "Tunnel-like" vision -Disturbance of hearing -Profuse sweating Treatment  -Sitting or laying down with usually resolve the symptoms -Lie down with your legs raised to help blood flow to your brain -Tense your fists and arms -Prevention Avoid hot places and Don't stand for long periods.  Risk Frequent vasovagal syncope can negatively impact quality of life  Atypical vasovagal syncope can be associated with injury or hospital admissions    Orders Placed This Encounter  Procedures   CBC with Differential/Platelets   Comprehensive metabolic panel   TSH Rfx on Abnormal to Free T4   Vitamin D, 25-hydroxy   T4F   Meds ordered this encounter  Medications   acetaZOLAMIDE  (DIAMOX) 250 MG tablet    Sig: Take 2 tablets (500 mg total) by mouth 2 (two) times daily.    Dispense:  360 tablet  Refill:  4    Cc: Loura Pardon, NP,  Loura Pardon, NP  Naomie Dean, MD  [facility] [address] [place], [place] [address]  Phone [phone] Fax [phone]  I spent over 60 minutes of face-to-face and non-face-to-face time with patient on the  1. IIH (idiopathic intracranial hypertension)   2. Syncope, unspecified syncope type   3. Long term use of drug    diagnosis.  This included previsit chart review, lab review, study review, order entry, electronic health record documentation, patient education on the different diagnostic and therapeutic options, counseling and coordination of care, risks and benefits of management, compliance, or risk factor reduction

## 2023-09-02 NOTE — Patient Instructions (Addendum)
Medication effect (diamox) vs Vasovagal syncope vs Cardiac Cause(see primary care for evaluation)  Can try decreasing the Diamox, if symptoms recur go back to last dose. Decrease to 1 pill in the morning and 2 at night for 1-2 weeks then decrease to 1 pill twice daily  To avoid vasovagal syncope, you can: stay hydrated by drinking plenty of fluids, avoid standing for long periods, eat regular meals with adequate salt, recognize and manage triggers like the sight of blood, change positions slowly, and if you feel symptoms coming on, sit or lie down with your legs elevated  Vasovagal syncope: also known as neurocardiogenic syncope, is a type of fainting that occurs when the body's reaction to a trigger causes a sudden drop in blood pressure and heart rate. It's the most common cause of fainting.  Causes  -Extreme fear or pain -Seeing something that disgusts you, like blood -Standing for a long time -Stressful life events or emotional strain Symptoms  -Dizziness -Feeling hot or cold -Nausea -Pale skin "Tunnel-like" vision -Disturbance of hearing -Profuse sweating Treatment  -Sitting or laying down with usually resolve the symptoms -Lie down with your legs raised to help blood flow to your brain -Tense your fists and arms -Prevention Avoid hot places and Don't stand for long periods.  Risk Frequent vasovagal syncope can negatively impact quality of life  Atypical vasovagal syncope can be associated with injury or hospital admissions  Acetazolamide Injection What is this medication? ACETAZOLAMIDE (a set a ZOLE a mide) reduces swelling related to heart disease. It may also be used to reduce swelling caused by medications. It helps your kidneys remove more fluid and salt from your blood through the urine. It can be used to treat conditions with increased pressure of the eye, such as glaucoma. It can also be used with other medications to prevent and control seizures in people with epilepsy.  It belongs to a group of medications called diuretics. This medicine may be used for other purposes; ask your health care provider or pharmacist if you have questions. COMMON BRAND NAME(S): Diamox What should I tell my care team before I take this medication? They need to know if you have any of these conditions: Diabetes Glaucoma Kidney disease Liver disease Low adrenal gland function Lung or breathing disease, such as asthma or COPD An unusual or allergic reaction to acetazolamide, sulfa, other medications, foods, dyes, or preservatives Pregnant or trying to get pregnant Breastfeeding How should I use this medication? This medication is injected into a vein. It is given by your care team in a hospital or clinic setting. Talk to your care team about the use of this medication in children. Special care may be needed. Overdosage: If you think you have taken too much of this medicine contact a poison control center or emergency room at once. NOTE: This medicine is only for you. Do not share this medicine with others. What if I miss a dose? This does not apply. This medication is not for regular use. What may interact with this medication? Do not take this medication with any of the following: Methazolamide This medication may also interact with the following: Aspirin and aspirin-like medications Cyclosporine Lithium Medications for diabetes Methenamine Other diuretics Phenytoin Primidone Quinidine Sodium bicarbonate Stimulant medications for ADHD, weight loss, or staying awake This list may not describe all possible interactions. Give your health care provider a list of all the medicines, herbs, non-prescription drugs, or dietary supplements you use. Also tell them if you smoke, drink  alcohol, or use illegal drugs. Some items may interact with your medicine. What should I watch for while using this medication? Your condition will be monitored carefully while you are receiving this  medication. This medication may cause serious skin reactions. They can happen weeks to months after starting the medication. Contact your care team right away if you notice fevers or flu-like symptoms with a rash. The rash may be red or purple and then turn into blisters or peeling of the skin. Or, you might notice a red rash with swelling of the face, lips, or lymph nodes in your neck or under your arms. This medication may affect your coordination, reaction time, or judgment. Do not drive or operate machinery until you know how this medication affects you. Sit up or stand slowly to reduce the risk of dizzy or fainting spells. What side effects may I notice from receiving this medication? Side effects that you should report to your care team as soon as possible: Allergic reactions--skin rash, itching, hives, swelling of the face, lips, tongue, or throat Aplastic anemia--unusual weakness or fatigue, dizziness, headache, trouble breathing, increased bleeding or bruising High acid level--trouble breathing, unusual weakness or fatigue, confusion, headache, fast or irregular heartbeat, nausea, vomiting High blood sugar (hyperglycemia)--increased thirst or amount of urine, unusual weakness or fatigue, blurry vision Infection--fever, chills, cough, or sore throat Kidney stones--blood in the urine, pain or trouble passing urine, pain in the lower back or sides Liver injury--right upper belly pain, loss of appetite, nausea, light-colored stool, dark yellow or brown urine, yellowing skin or eyes, unusual weakness or fatigue Low blood sugar (hypoglycemia)--tremors or shaking, anxiety, sweating, cold or clammy skin, confusion, dizziness, rapid heartbeat Low potassium level--muscle pain or cramps, unusual weakness or fatigue, fast or irregular heartbeat, constipation Redness, blistering, peeling, or loosening of the skin, including inside the mouth Side effects that usually do not require medical attention  (report to your care team if they continue or are bothersome): Change in taste Diarrhea Dizziness Fatigue Nausea Pain, tingling, or numbness in the hands or feet This list may not describe all possible side effects. Call your doctor for medical advice about side effects. You may report side effects to FDA at 1-800-FDA-1088. Where should I keep my medication? This medication is given in a hospital or clinic. It will not be stored at home. NOTE: This sheet is a summary. It may not cover all possible information. If you have questions about this medicine, talk to your doctor, pharmacist, or health care provider.  2024 Elsevier/Gold Standard (2023-03-19 00:00:00)

## 2023-09-03 ENCOUNTER — Telehealth: Payer: Self-pay | Admitting: *Deleted

## 2023-09-03 ENCOUNTER — Encounter: Payer: Self-pay | Admitting: *Deleted

## 2023-09-03 LAB — COMPREHENSIVE METABOLIC PANEL
ALT: 11 [IU]/L (ref 0–32)
AST: 13 [IU]/L (ref 0–40)
Albumin: 4.6 g/dL (ref 4.0–5.0)
Alkaline Phosphatase: 76 [IU]/L (ref 44–121)
BUN/Creatinine Ratio: 16 (ref 9–23)
BUN: 12 mg/dL (ref 6–20)
Bilirubin Total: 0.4 mg/dL (ref 0.0–1.2)
CO2: 16 mmol/L — ABNORMAL LOW (ref 20–29)
Calcium: 9 mg/dL (ref 8.7–10.2)
Chloride: 109 mmol/L — ABNORMAL HIGH (ref 96–106)
Creatinine, Ser: 0.75 mg/dL (ref 0.57–1.00)
Globulin, Total: 2.6 g/dL (ref 1.5–4.5)
Glucose: 94 mg/dL (ref 70–99)
Potassium: 4.1 mmol/L (ref 3.5–5.2)
Sodium: 140 mmol/L (ref 134–144)
Total Protein: 7.2 g/dL (ref 6.0–8.5)
eGFR: 110 mL/min/{1.73_m2} (ref >59)

## 2023-09-03 LAB — CBC WITH DIFFERENTIAL/PLATELET
Basophils Absolute: 0.1 10*3/uL (ref 0.0–0.2)
Basos: 1 %
EOS (ABSOLUTE): 0.2 10*3/uL (ref 0.0–0.4)
Eos: 2 %
Hematocrit: 46.3 % (ref 34.0–46.6)
Hemoglobin: 14.7 g/dL (ref 11.1–15.9)
Immature Grans (Abs): 0.1 10*3/uL (ref 0.0–0.1)
Immature Granulocytes: 1 %
Lymphocytes Absolute: 2.1 10*3/uL (ref 0.7–3.1)
Lymphs: 23 %
MCH: 27.6 pg (ref 26.6–33.0)
MCHC: 31.7 g/dL (ref 31.5–35.7)
MCV: 87 fL (ref 79–97)
Monocytes Absolute: 0.5 10*3/uL (ref 0.1–0.9)
Monocytes: 6 %
Neutrophils Absolute: 6.3 10*3/uL (ref 1.4–7.0)
Neutrophils: 67 %
Platelets: 415 10*3/uL (ref 150–450)
RBC: 5.33 x10E6/uL — ABNORMAL HIGH (ref 3.77–5.28)
RDW: 13 % (ref 11.7–15.4)
WBC: 9.3 10*3/uL (ref 3.4–10.8)

## 2023-09-03 LAB — VITAMIN D 25 HYDROXY (VIT D DEFICIENCY, FRACTURES): Vit D, 25-Hydroxy: 11.8 ng/mL — ABNORMAL LOW (ref 30.0–100.0)

## 2023-09-03 LAB — TSH RFX ON ABNORMAL TO FREE T4: TSH: 4.58 u[IU]/mL — ABNORMAL HIGH (ref 0.450–4.500)

## 2023-09-03 LAB — T4F: T4,Free (Direct): 0.82 ng/dL (ref 0.82–1.77)

## 2023-09-03 NOTE — Telephone Encounter (Signed)
-----   Message from Huston Foley sent at 09/03/2023  4:19 PM EST ----- TSH mildly elevated and vitamin D very low.  I recommend FU with PCP to discuss prescription vitamin D and evaluating for thyroid dysfunction. In the meantime, I recommend pt start an OTC vitamin D supplement, about 2000 units daily for now, unless she is already on one.

## 2023-09-03 NOTE — Telephone Encounter (Signed)
Spoke with pt and discussed lab results and recommendations below as noted by Dr Frances Furbish. The patient verbalized understanding. She does not have a PCP but she agreed to find one and agreed to start Vitamin D 2000 units daily OTC. I sent her a link in mychart to search for a [facility] affiliated primary care provider. Pt thanked me for the call.

## 2023-09-07 ENCOUNTER — Encounter: Payer: Self-pay | Admitting: Neurology

## 2023-10-15 ENCOUNTER — Encounter: Payer: Self-pay | Admitting: Internal Medicine

## 2023-10-15 ENCOUNTER — Ambulatory Visit (INDEPENDENT_AMBULATORY_CARE_PROVIDER_SITE_OTHER): Payer: Self-pay | Admitting: Internal Medicine

## 2023-10-15 VITALS — BP 124/84 | HR 79 | Temp 98.0°F | Ht 65.0 in | Wt 209.2 lb

## 2023-10-15 DIAGNOSIS — Z6834 Body mass index (BMI) 34.0-34.9, adult: Secondary | ICD-10-CM

## 2023-10-15 DIAGNOSIS — R7989 Other specified abnormal findings of blood chemistry: Secondary | ICD-10-CM

## 2023-10-15 DIAGNOSIS — E6609 Other obesity due to excess calories: Secondary | ICD-10-CM | POA: Insufficient documentation

## 2023-10-15 DIAGNOSIS — G932 Benign intracranial hypertension: Secondary | ICD-10-CM | POA: Diagnosis not present

## 2023-10-15 DIAGNOSIS — E559 Vitamin D deficiency, unspecified: Secondary | ICD-10-CM | POA: Diagnosis not present

## 2023-10-15 DIAGNOSIS — E66811 Obesity, class 1: Secondary | ICD-10-CM | POA: Diagnosis not present

## 2023-10-15 NOTE — Progress Notes (Signed)
 New Patient Office Visit     CC/Reason for Visit: Establish care, discuss chronic and acute concerns Previous PCP: Unknown Last Visit: Unknown  HPI: Diana Randolph is a 33 y.o. female who is coming in today for the above mentioned reasons. Past Medical History is significant for: Idiopathic intracranial hypertension, obesity.  She was recently seen by neurology and was diagnosed with vitamin D deficiency and an elevated TSH and asked to establish care with PCP.  She is otherwise feeling well.   Past Medical/Surgical History: Past Medical History:  Diagnosis Date   Depression    IIH (idiopathic intracranial hypertension)     Past Surgical History:  Procedure Laterality Date   BIOPSY  04/09/2021   Procedure: BIOPSY;  Surgeon: Rachael Fee, MD;  Location: [facility] ENDOSCOPY;  Service: Endoscopy;;   ESOPHAGOGASTRODUODENOSCOPY (EGD) WITH PROPOFOL N/A 04/09/2021   Procedure: ESOPHAGOGASTRODUODENOSCOPY (EGD) WITH PROPOFOL;  Surgeon: Rachael Fee, MD;  Location: [facility] ENDOSCOPY;  Service: Endoscopy;  Laterality: N/A;    Social History:  reports that she quit smoking about 6 years ago. Her smoking use included cigarettes. She has a 0.3 pack-year smoking history. She has never used smokeless tobacco. She reports current alcohol use. She reports that she does not currently use drugs after having used the following drugs: Cocaine and Marijuana.  Allergies: Allergies  Allergen Reactions   Azithromycin Diarrhea   Tape Itching and Other (See Comments)    Adhesive    Buspirone Palpitations    Family History:  Family History  Problem Relation Age of Onset   Healthy Mother    Heart attack Father    Diabetes Father    Heart disease Father    Stroke Maternal Grandmother      Current Outpatient Medications:    acetaminophen (TYLENOL) 500 MG tablet, Take 500-1,000 mg by mouth as needed (pain)., Disp: , Rfl:    acetaZOLAMIDE (DIAMOX) 250 MG tablet, Take 2 tablets (500 mg total) by  mouth 2 (two) times daily., Disp: 360 tablet, Rfl: 4   norethindrone (MICRONOR,CAMILA,ERRIN) 0.35 MG tablet, Take 1 tablet by mouth at bedtime., Disp: , Rfl: 6  Review of Systems:  Negative except as indicated in HPI.   Physical Exam: Vitals:   10/15/23 1519  BP: 124/84  Pulse: 79  Temp: 98 F (36.7 C)  TempSrc: Oral  SpO2: 100%  Weight: 209 lb 3.2 oz (94.9 kg)  Height: 5\' 5"  (1.651 m)   Body mass index is 34.81 kg/m.  Physical Exam Vitals reviewed.  Constitutional:      Appearance: Normal appearance. She is obese.  HENT:     Head: Normocephalic and atraumatic.  Eyes:     Conjunctiva/sclera: Conjunctivae normal.  Cardiovascular:     Rate and Rhythm: Normal rate and regular rhythm.  Pulmonary:     Effort: Pulmonary effort is normal.     Breath sounds: Normal breath sounds.  Skin:    General: Skin is warm and dry.  Neurological:     Mental Status: She is alert and oriented to person, place, and time.  Psychiatric:        Mood and Affect: Mood normal.        Behavior: Behavior normal.        Thought Content: Thought content normal.        Judgment: Judgment normal.       Impression and Plan:  IIH (idiopathic intracranial hypertension) -     Comprehensive metabolic panel; Future  Class 1 obesity  due to excess calories without serious comorbidity with body mass index (BMI) of 34.0 to 34.9 in adult -     Hemoglobin A1c; Future  Elevated TSH -     TSH; Future -     T4, free; Future -     T3, free; Future  Vitamin D deficiency -     VITAMIN D 25 Hydroxy (Vit-D Deficiency, Fractures); Future   -Repeat thyroid and vitamin D labs to determine appropriate supplementation and/or treatment if needed. -Due to family history of diabetes we will check A1c today.  Time spent: 46 minutes reviewing chart, interviewing and examining patient and formulating plan of care.     Chaya Jan, MD [facility]

## 2023-10-16 ENCOUNTER — Encounter: Payer: Self-pay | Admitting: Internal Medicine

## 2023-10-16 ENCOUNTER — Other Ambulatory Visit: Payer: Self-pay | Admitting: Internal Medicine

## 2023-10-16 DIAGNOSIS — E559 Vitamin D deficiency, unspecified: Secondary | ICD-10-CM

## 2023-10-16 LAB — COMPREHENSIVE METABOLIC PANEL
ALT: 10 U/L (ref 0–35)
AST: 12 U/L (ref 0–37)
Albumin: 4.4 g/dL (ref 3.5–5.2)
Alkaline Phosphatase: 56 U/L (ref 39–117)
BUN: 12 mg/dL (ref 6–23)
CO2: 20 meq/L (ref 19–32)
Calcium: 8.9 mg/dL (ref 8.4–10.5)
Chloride: 110 meq/L (ref 96–112)
Creatinine, Ser: 0.67 mg/dL (ref 0.40–1.20)
GFR: 117.42 mL/min (ref >60.00)
Glucose, Bld: 85 mg/dL (ref 70–99)
Potassium: 3.6 meq/L (ref 3.5–5.1)
Sodium: 139 meq/L (ref 135–145)
Total Bilirubin: 0.5 mg/dL (ref 0.2–1.2)
Total Protein: 7.3 g/dL (ref 6.0–8.3)

## 2023-10-16 LAB — VITAMIN D 25 HYDROXY (VIT D DEFICIENCY, FRACTURES): VITD: 19.02 ng/mL — ABNORMAL LOW (ref 30.00–100.00)

## 2023-10-16 LAB — TSH: TSH: 4.6 u[IU]/mL (ref 0.35–5.50)

## 2023-10-16 LAB — HEMOGLOBIN A1C: Hgb A1c MFr Bld: 5.4 % (ref 4.6–6.5)

## 2023-10-16 LAB — T3, FREE: T3, Free: 3.1 pg/mL (ref 2.3–4.2)

## 2023-10-16 LAB — T4, FREE: Free T4: 0.75 ng/dL (ref 0.60–1.60)

## 2023-10-16 MED ORDER — VITAMIN D (ERGOCALCIFEROL) 1.25 MG (50000 UNIT) PO CAPS
50000.0000 [IU] | ORAL_CAPSULE | ORAL | 0 refills | Status: AC
Start: 2023-10-16 — End: 2024-01-02

## 2023-12-31 ENCOUNTER — Other Ambulatory Visit: Payer: Self-pay | Admitting: Internal Medicine

## 2023-12-31 DIAGNOSIS — E559 Vitamin D deficiency, unspecified: Secondary | ICD-10-CM

## 2024-01-16 ENCOUNTER — Other Ambulatory Visit (INDEPENDENT_AMBULATORY_CARE_PROVIDER_SITE_OTHER)

## 2024-01-16 DIAGNOSIS — E559 Vitamin D deficiency, unspecified: Secondary | ICD-10-CM | POA: Diagnosis not present

## 2024-01-16 LAB — VITAMIN D 25 HYDROXY (VIT D DEFICIENCY, FRACTURES): VITD: 27.9 ng/mL — ABNORMAL LOW (ref 30.00–100.00)

## 2024-01-19 ENCOUNTER — Encounter: Payer: Self-pay | Admitting: Internal Medicine

## 2024-01-19 DIAGNOSIS — E559 Vitamin D deficiency, unspecified: Secondary | ICD-10-CM

## 2024-01-27 MED ORDER — VITAMIN D (ERGOCALCIFEROL) 1.25 MG (50000 UNIT) PO CAPS: 50000.0000 [IU] | ORAL_CAPSULE | ORAL | 0 refills | Status: AC

## 2024-02-02 ENCOUNTER — Ambulatory Visit: Payer: Self-pay | Admitting: Internal Medicine

## 2024-02-02 DIAGNOSIS — E559 Vitamin D deficiency, unspecified: Secondary | ICD-10-CM

## 2024-03-02 ENCOUNTER — Telehealth: Payer: 59 | Admitting: Neurology

## 2024-03-02 ENCOUNTER — Telehealth: Payer: Self-pay | Admitting: Pharmacist

## 2024-03-02 ENCOUNTER — Telehealth: Payer: Self-pay | Admitting: Neurology

## 2024-03-02 ENCOUNTER — Encounter: Payer: Self-pay | Admitting: Neurology

## 2024-03-02 DIAGNOSIS — G43009 Migraine without aura, not intractable, without status migrainosus: Secondary | ICD-10-CM | POA: Diagnosis not present

## 2024-03-02 DIAGNOSIS — G932 Benign intracranial hypertension: Secondary | ICD-10-CM

## 2024-03-02 MED ORDER — NURTEC 75 MG PO TBDP: 75.0000 mg | ORAL_TABLET | Freq: Every day | ORAL | 11 refills | Status: AC | PRN

## 2024-03-02 NOTE — Telephone Encounter (Signed)
 Pharmacy Patient Advocate Encounter   Received notification from CoverMyMeds that prior authorization for Nurtec 75MG  dispersible tablets is required/requested.   Insurance verification completed.   The patient is insured through [employer] .   Per test claim: PA required; PA submitted to above mentioned insurance via CoverMyMeds Key/confirmation #/EOC [id number] Status is pending

## 2024-03-02 NOTE — Telephone Encounter (Signed)
 Please call patient for in office follow up with Dr. Ines in December thank you

## 2024-03-02 NOTE — Telephone Encounter (Signed)
 Pharmacy Patient Advocate Encounter   Received notification from CoverMyMeds that prior authorization for Nurtec 75MG  dispersible tablets is required/requested.   Insurance verification completed.   The patient is insured through [employer] [facility] .   Per test claim: PA required; PA submitted to above mentioned insurance via CoverMyMeds Key/confirmation #/EOC [id number] Status is pending

## 2024-03-02 NOTE — Progress Notes (Signed)
 [facility]    Provider:  Dr Ines Requesting Provider: Shirl Greig Maxwell, NP Primary Care Provider:  Theophilus Andrews, Tully GRADE, MD  CC:  syncope, likely vasovagal, IDIOPATHIC INTRACRANIAL HYPERTENSION   Virtual Visit via Video Note  I connected with Diana Randolph Ask on 03/02/24 at  8:30 AM EDT by a video enabled telemedicine application and verified that I am speaking with the correct person using two identifiers.  Location: Patient: Home Provider: Office   I discussed the limitations of evaluation and management by telemedicine and the availability of in person appointments. The patient expressed understanding and agreed to proceed.   Follow Up Instructions:    I discussed the assessment and treatment plan with the patient. The patient was provided an opportunity to ask questions and all were answered. The patient agreed with the plan and demonstrated an understanding of the instructions.   The patient was advised to call back or seek an in-person evaluation if the symptoms worsen or if the condition fails to improve as anticipated.  I provided 32 minutes of non-face-to-face time during this encounter.   Onetha KATHEE Ines, MD   03/02/2024: She has cut her medication to one bid and has had just a few headaches since then, she decreased 5 months ago. This week is a little worse with some pain behind the eye. F/u at least yearly with ophthalmology, make appt has not gone since 03/2022(?) she will look into it. She hs had some headaches the last week and pain behind the eye just one eye, the same pain and pressure. The headaches this week have been pulsating/pounding and light sensitivity, could this be migrainous? She has had approx 4 migraine days a month and < 8 total headache days a month. Also light sensitivity, some sound. No other focal neurologic deficits, associated symptoms, inciting events or modifiable factors.Patient complains of symptoms per HPI as well as  the following symptoms: per hpi . Pertinent negatives and positives per HPI. All others negative   From a thorough review of records and patient report, Medications tried that can be used in migraine/headache management greater than 3 months include: Lifestyle modification, headache diaries, better sleep hygiene, exercise, management of migraine triggers, OTC and prescribed analgesics/nsaids such as ibuprofen, excedrin, alleve and others, imitrex, rizatriptan(side effects), nortriptyline , blood pressure medications contraindicated due to hypotension, diamox  (cannot take topiramate as interats with diamox /conraindicated while on diamox  due to possibility of hypercloremic acidosis on both)   HPI 09/02/2023:  Diana Randolph is a 33 y.o. female here as requested by Shirl Greig Maxwell, NP in 2018 with IDIOPATHIC INTRACRANIAL HYPERTENSION. Her neurologist at the time put her on cymbalta  and topamax at that time. She was alson and anti-anxiety medication. She went into the hospital in 04/02/2021 and was in there in 6 days for marijuana hyperemesis syndrome and she could not take her medications sp she reached out to her neurologist and they decided to come off meds and from 03/2021 until the summer of 2023 she had no headaches. Summer of 2023 she started getting a headache, flashing lights, nausea, pulsating/pounding/throbbing and got better laying down, sitting and standing made it worse. She saw her neurologist again and she had imaging and an LP and she was on Diamox  when getting the lumbar puncture and took it down to 14 and she felt better and since then she has not had significant headaches she has mild headaches sometimes in the occipital area. She still has dizzy symptoms but no headaches.  She gets dizzy from standing up too long. When standing too long she gets dizzy/lightheaded, feels like she may pass out if she stands too long, when she sits down she feels better. She can be at the grocery store and take a  minute or hold on to the buggy.  She has this feeling a few times a week.  Reviewed notes, labs and imaging from outside physicians, which showed;  02/04/2022: MRI brain  TECHNIQUE/PROTOCOL: Standard adult brain protocol without and with IV  contrast.   FINDINGS:  Brain Parenchyma:  No hemorrhage, cerebral edema, acute cortical  infarction, mass, mass effect, or midline shift.  No abnormal enhancement.  Partially empty and expanded bony sella.  Ventricles and Sulci: Normal for age.   Extra-Axial Spaces: No extra-axial fluid collection.  Basal Cisterns: Normal.  Intracranial Flow-Voids: Symmetric bilateral narrowing of the transverse  sigmoid junctions   Paranasal Sinuses: Normal.  Mastoid air cells: well aerated  Orbits: Possible flattening of the bilateral posterior globes at the  insertion of the optic nerves.  Cranium: Normal.    IMPRESSION:  There are multiple findings which are nonspecific though suggestive of  intracranial hypertension. Consider correlation with LP and opening  pressures.   Reviewed notes from ophthalmology: 03/2022:  -on diamox  250mg -500mg  bid, headaches and ocular symptoms have improved,  no hx of transient vision loss, nonspecific splotches of vision  temporally Left Eye>Right Eye transient and improving  -under care of [facility]y of [facility]  -HVF 24-2:wnl ou today  - no signs of optic disc edema ou  -cont fu with neurology      Review of Systems: Patient complains of symptoms per HPI as well as the following symptoms none. Pertinent negatives and positives per HPI. All others negative.   Social History   Socioeconomic History   Marital status: Single    Spouse name: Not on file   Number of children: Not on file   Years of education: Not on file   Highest education level: Bachelor's degree (e.g., BA, AB, BS)  Occupational History   Not on file  Tobacco Use   Smoking status: Former    Current packs/day: 0.00    Average packs/day:  0.3 packs/day for 1 year (0.3 ttl pk-yrs)    Types: Cigarettes    Quit date: 06/23/2017    Years since quitting: 6.6   Smokeless tobacco: Never  Vaping Use   Vaping status: Never Used  Substance and Sexual Activity   Alcohol use: Yes    Comment: I have half a bottle of ginger beer sometimes   Drug use: Not Currently    Types: Cocaine, Marijuana    Comment: marijuana   Sexual activity: Yes    Birth control/protection: Pill  Other Topics Concern   Not on file  Social History Narrative   Not on file   Social Drivers of Health   Financial Resource Strain: Low Risk  (10/14/2023)   Overall Financial Resource Strain (CARDIA)    Difficulty of Paying Living Expenses: Not hard at all  Food Insecurity: No Food Insecurity (10/14/2023)   Hunger Vital Sign    Worried About Running Out of Food in the Last Year: Never true    Ran Out of Food in the Last Year: Never true  Transportation Needs: No Transportation Needs (10/14/2023)   PRAPARE - Administrator, Civil Service (Medical): No    Lack of Transportation (Non-Medical): No  Physical Activity: Unknown (10/14/2023)   Exercise Vital Sign  Days of Exercise per Week: 0 days    Minutes of Exercise per Session: Not on file  Stress: No Stress Concern Present (10/14/2023)   [employer] of Occupational Health - Occupational Stress Questionnaire    Feeling of Stress : Not at all  Social Connections: Moderately Isolated (10/14/2023)   Social Connection and Isolation Panel    Frequency of Communication with Friends and Family: More than three times a week    Frequency of Social Gatherings with Friends and Family: Once a week    Attends Religious Services: Never    Database administrator or Organizations: No    Attends Engineer, structural: Not on file    Marital Status: Living with partner  Intimate Partner Violence: Unknown (11/16/2021)   Received from [facility]   HITS    Physically Hurt: Not on file    Insult or Talk  Down To: Not on file    Threaten Physical Harm: Not on file    Scream or Curse: Not on file    Family History  Problem Relation Age of Onset   Healthy Mother    Heart attack Father    Diabetes Father    Heart disease Father    Stroke Maternal Grandmother     Past Medical History:  Diagnosis Date   Depression    IIH (idiopathic intracranial hypertension)     Patient Active Problem List   Diagnosis Date Noted   Migraine without aura and without status migrainosus, not intractable 03/02/2024   IIH (idiopathic intracranial hypertension) 10/15/2023   Vitamin D  deficiency 10/15/2023   Class 1 obesity due to excess calories without serious comorbidity with body mass index (BMI) of 34.0 to 34.9 in adult 10/15/2023   Nausea & vomiting 04/08/2021   Intractable nausea and vomiting 04/08/2021    Past Surgical History:  Procedure Laterality Date   BIOPSY  04/09/2021   Procedure: BIOPSY;  Surgeon: Teressa Toribio SQUIBB, MD;  Location: [facility] ENDOSCOPY;  Service: Endoscopy;;   ESOPHAGOGASTRODUODENOSCOPY (EGD) WITH PROPOFOL  N/A 04/09/2021   Procedure: ESOPHAGOGASTRODUODENOSCOPY (EGD) WITH PROPOFOL ;  Surgeon: Teressa Toribio SQUIBB, MD;  Location: [facility] ENDOSCOPY;  Service: Endoscopy;  Laterality: N/A;    Current Outpatient Medications  Medication Sig Dispense Refill   Rimegepant Sulfate (NURTEC) 75 MG TBDP Take 1 tablet (75 mg total) by mouth daily as needed. For migraines. Take as close to onset of migraine as possible. One daily maximum. 16 tablet 11   acetaminophen  (TYLENOL ) 500 MG tablet Take 500-1,000 mg by mouth as needed (pain).     acetaZOLAMIDE  (DIAMOX ) 250 MG tablet Take 2 tablets (500 mg total) by mouth 2 (two) times daily. 360 tablet 4   norethindrone  (MICRONOR ,CAMILA ,ERRIN ) 0.35 MG tablet Take 1 tablet by mouth at bedtime.  6   Vitamin D , Ergocalciferol , (DRISDOL ) 1.25 MG (50000 UNIT) CAPS capsule Take 1 capsule (50,000 Units total) by mouth every 7 (seven) days. 12 capsule 0   No current  facility-administered medications for this visit.    Allergies as of 03/02/2024 - Review Complete 03/02/2024  Allergen Reaction Noted   Azithromycin Diarrhea 10/26/2015   Tape Itching and Other (See Comments) 05/10/2018   Buspirone Palpitations 01/01/2018    Vitals: There were no vitals taken for this visit. Last Weight:  Wt Readings from Last 1 Encounters:  10/15/23 209 lb 3.2 oz (94.9 kg)   Last Height:   Ht Readings from Last 1 Encounters:  10/15/23 5' 5 (1.651 m)     Physical exam:  Exam: Gen: NAD, conversant      CV: No palpitations or chest pain or SOB. VS: Breathing at a normal rate. Not febrile. Eyes: Conjunctivae clear without exudates or hemorrhage  Neuro: Detailed Neurologic Exam  Speech:    Speech is normal; fluent and spontaneous with normal comprehension.  Cognition:    The patient is oriented to person, place, and time;     recent and remote memory intact;     language fluent;     normal attention, concentration, fund of knowledge Cranial Nerves:    The pupils are equal, round, and reactive to light. Visual fields are full Extraocular movements are intact.  The face is symmetric with normal sensation. The palate elevates in the midline. Hearing intact. Voice is normal. Shoulder shrug is normal. The tongue has normal motion without fasciculations.   Coordination: normal  Gait:    No abnormalities noted or reported  Motor Observation:   no involuntary movements noted. Tone:    Appears normal  Posture:    Posture is normal. normal erect    Strength:    Strength is anti-gravity and symmetric in the upper and lower limbs.      Sensation: intact to LT, no reports of numbness or tingling or paresthesias             Assessment/Plan:  Doing well on actazolamide. Has lost 40 pounds and appeared to be having side effects to the diamox  so we decreased to 250mg  bid (instead of 500mg  bid) and doing well until this past week.   Also prior episodes  appear vasovagal and we discussed that at length as below  IDIOPATHIC INTRACRANIAL HYPERTENSION vs Migraines, likely both Call ophthalmology get a follow up  Continue the diamox  250mg  twice daily Prescribe Nurtec for migraines as needed and see if your current headache responds to nurtec then these may be migraines as opposed to IDIOPATHIC INTRACRANIAL HYPERTENSION If you do respond to migraine medication, we may consider treating for migraines and we can talk about migraine and headache management in pregnancy at a follow up appointment since you are thinking about getting pregnant (sees obgyn in speptember) If the headache not improved with nurtec, could increase the diamox  for a few days and see how it goes then go back down to 1 tab twice a day since she has been doing so well We can further meet to discuss headache management in pregnancy when you are ready(schedule f/u in December) Nurtec ODT copay card: XULive.[email or web address] > savings - please use to help with copay Follow up in the office after you meet with obgyn in September will call   Continue weight loss  Medication effect (diamox ) vs Vasovagal syncope vs Cardiac Cause(see primary care for evaluation):  To avoid vasovagal syncope, you can: stay hydrated by drinking plenty of fluids, avoid standing for long periods, eat regular meals with adequate salt, recognize and manage triggers like the sight of blood, change positions slowly, and if you feel symptoms coming on, sit or lie down with your legs elevated  Vasovagal syncope: also known as neurocardiogenic syncope, is a type of fainting that occurs when the body's reaction to a trigger causes a sudden drop in blood pressure and heart rate. It's the most common cause of fainting.  Causes  -Extreme fear or pain -Seeing something that disgusts you, like blood -Standing for a long time -Stressful life events or emotional strain Symptoms  -Dizziness -Feeling hot or  cold -Nausea -Pale skin Tunnel-like vision -Disturbance of  hearing -Profuse sweating Treatment  -Sitting or laying down with usually resolve the symptoms -Lie down with your legs raised to help blood flow to your brain -Tense your fists and arms -Prevention Avoid hot places and Don't stand for long periods.  Risk Frequent vasovagal syncope can negatively impact quality of life  Atypical vasovagal syncope can be associated with injury or hospital admissions    No orders of the defined types were placed in this encounter.  Meds ordered this encounter  Medications   Rimegepant Sulfate (NURTEC) 75 MG TBDP    Sig: Take 1 tablet (75 mg total) by mouth daily as needed. For migraines. Take as close to onset of migraine as possible. One daily maximum.    Dispense:  16 tablet    Refill:  11    Cc: Shirl Greig Maxwell, NP,  Theophilus Andrews, Tully GRADE, MD  Onetha Epp, MD  [facility] [address] [place], [place] [address]  Phone [phone] Fax [phone]  I spent over 60 minutes of face-to-face and non-face-to-face time with patient on the  1. IIH (idiopathic intracranial hypertension)   2. Migraine without aura and without status migrainosus, not intractable     diagnosis.  This included previsit chart review, lab review, study review, order entry, electronic health record documentation, patient education on the different diagnostic and therapeutic options, counseling and coordination of care, risks and benefits of management, compliance, or risk factor reduction

## 2024-03-02 NOTE — Patient Instructions (Addendum)
 Call ophthalmology get a follow up  Continue the diamox  250mg  twice daily Prescribe Nurtec for migraines as needed and see if your current headache responds to nurtec then these may be migraines as opposed to IDIOPATHIC INTRACRANIAL HYPERTENSION If you do respond to migraine medication, we may consider treating for migraines and we can talk about migraine and headache management in pregnancy If the headache not improved with nurtec, could increase the diamox  for a few days and see how it goes then go back down to 1 tab twice a day since she has been doing so well We can further meet to discuss headache management in pregnancy when you are ready Nurtec ODT copay card: XULive.tn > savings Follow up in the office after you meet with obgyn in September will call   Rimegepant Disintegrating Tablets What is this medication? RIMEGEPANT (ri ME je pant) prevents and treats migraines. It works by blocking a substance in the body that causes migraines. This medicine may be used for other purposes; ask your health care provider or pharmacist if you have questions. COMMON BRAND NAME(S): NURTEC ODT What should I tell my care team before I take this medication? They need to know if you have any of these conditions: Kidney disease Liver disease An unusual or allergic reaction to rimegepant, other medications, foods, dyes, or preservatives Pregnant or trying to get pregnant Breast-feeding How should I use this medication? Take this medication by mouth. Take it as directed on the prescription label. Leave the tablet in the sealed pack until you are ready to take it. With dry hands, open the pack and gently remove the tablet. If the tablet breaks or crumbles, throw it away. Use a new tablet. Place the tablet in the mouth and allow it to dissolve. Then, swallow it. Do not cut, crush, or chew this medication. You do not need water to take this medication. Talk to your care team about the use of this  medication in children. Special care may be needed. Overdosage: If you think you have taken too much of this medicine contact a poison control center or emergency room at once. NOTE: This medicine is only for you. Do not share this medicine with others. What if I miss a dose? This does not apply. This medication is not for regular use. What may interact with this medication? Certain medications for fungal infections, such as fluconazole, itraconazole Rifampin This list may not describe all possible interactions. Give your health care provider a list of all the medicines, herbs, non-prescription drugs, or dietary supplements you use. Also tell them if you smoke, drink alcohol, or use illegal drugs. Some items may interact with your medicine. What should I watch for while using this medication? Visit your care team for regular checks on your progress. Tell your care team if your symptoms do not start to get better or if they get worse. What side effects may I notice from receiving this medication? Side effects that you should report to your care team as soon as possible: Allergic reactions--skin rash, itching, hives, swelling of the face, lips, tongue, or throat Side effects that usually do not require medical attention (report to your care team if they continue or are bothersome): Nausea Stomach pain This list may not describe all possible side effects. Call your doctor for medical advice about side effects. You may report side effects to FDA at 1-800-FDA-1088. Where should I keep my medication? Keep out of the reach of children and pets. Store at room  temperature between 20 and 25 degrees C (68 and 77 degrees F). Get rid of any unused medication after the expiration date. To get rid of medications that are no longer needed or have expired: Take the medication to a medication take-back program. Check with your pharmacy or law enforcement to find a location. If you cannot return the medication,  check the label or package insert to see if the medication should be thrown out in the garbage or flushed down the toilet. If you are not sure, ask your care team. If it is safe to put it in the trash, take the medication out of the container. Mix the medication with cat litter, dirt, coffee grounds, or other unwanted substance. Seal the mixture in a bag or container. Put it in the trash. NOTE: This sheet is a summary. It may not cover all possible information. If you have questions about this medicine, talk to your doctor, pharmacist, or health care provider.  2024 Elsevier/Gold Standard (2021-09-19 00:00:00)

## 2024-03-02 NOTE — Telephone Encounter (Signed)
 Called patient and scheduled OV with Dr. Ines for 07/22/24 at 2:30pm

## 2024-03-05 NOTE — Telephone Encounter (Signed)
 Pharmacy Patient Advocate Encounter  Received notification from [facility] that Prior Authorization for Nurtec 75MG  dispersible tablets has been APPROVED from 03/02/2024 to 03/02/2025   PA #/Case ID/Reference #: PA Case ID #: [id number]

## 2024-04-17 ENCOUNTER — Other Ambulatory Visit: Payer: Self-pay | Admitting: Adult Health

## 2024-04-28 ENCOUNTER — Other Ambulatory Visit (INDEPENDENT_AMBULATORY_CARE_PROVIDER_SITE_OTHER)

## 2024-04-28 DIAGNOSIS — E559 Vitamin D deficiency, unspecified: Secondary | ICD-10-CM

## 2024-04-29 ENCOUNTER — Ambulatory Visit: Payer: Self-pay | Admitting: Internal Medicine

## 2024-04-29 LAB — VITAMIN D 25 HYDROXY (VIT D DEFICIENCY, FRACTURES): VITD: 44.92 ng/mL (ref 30.00–100.00)

## 2024-05-19 ENCOUNTER — Telehealth: Payer: Self-pay

## 2024-05-19 NOTE — Telephone Encounter (Signed)
 Called and left voicemail for patient to reschedule appointment on 12/11 with Dr Ines.  If patient calls back, they can be rescheduled with Dr Gregg

## 2024-07-22 ENCOUNTER — Ambulatory Visit: Admitting: Neurology

## 2024-09-20 ENCOUNTER — Ambulatory Visit: Admitting: Neurology
